# Patient Record
Sex: Female | Born: 1975 | Race: White | Hispanic: No | Marital: Married | Smoking: Never smoker
Health system: Southern US, Community
[De-identification: ages and names within clinical notes are randomized; demographics above are authoritative.]

## PROBLEM LIST (undated history)

## (undated) DIAGNOSIS — E039 Hypothyroidism, unspecified: Secondary | ICD-10-CM

## (undated) DIAGNOSIS — F32A Depression, unspecified: Secondary | ICD-10-CM

## (undated) DIAGNOSIS — F329 Major depressive disorder, single episode, unspecified: Secondary | ICD-10-CM

---

## 2001-01-30 ENCOUNTER — Inpatient Hospital Stay (HOSPITAL_COMMUNITY): Admission: EM | Admit: 2001-01-30 | Discharge: 2001-01-31 | Payer: Self-pay | Admitting: Emergency Medicine

## 2001-11-25 ENCOUNTER — Ambulatory Visit (HOSPITAL_COMMUNITY): Admission: RE | Admit: 2001-11-25 | Discharge: 2001-11-25 | Payer: Self-pay | Admitting: Internal Medicine

## 2001-11-25 ENCOUNTER — Encounter: Payer: Self-pay | Admitting: Internal Medicine

## 2002-09-07 ENCOUNTER — Encounter: Payer: Self-pay | Admitting: General Surgery

## 2002-09-07 ENCOUNTER — Ambulatory Visit (HOSPITAL_COMMUNITY): Admission: RE | Admit: 2002-09-07 | Discharge: 2002-09-07 | Payer: Self-pay | Admitting: General Surgery

## 2004-09-20 ENCOUNTER — Other Ambulatory Visit: Admission: RE | Admit: 2004-09-20 | Discharge: 2004-09-20 | Payer: Self-pay | Admitting: Obstetrics and Gynecology

## 2005-01-18 ENCOUNTER — Ambulatory Visit (HOSPITAL_COMMUNITY): Admission: RE | Admit: 2005-01-18 | Discharge: 2005-01-18 | Payer: Self-pay | Admitting: Obstetrics and Gynecology

## 2005-04-14 ENCOUNTER — Inpatient Hospital Stay (HOSPITAL_COMMUNITY): Admission: AD | Admit: 2005-04-14 | Discharge: 2005-04-18 | Payer: Self-pay | Admitting: Obstetrics and Gynecology

## 2005-04-15 ENCOUNTER — Encounter (INDEPENDENT_AMBULATORY_CARE_PROVIDER_SITE_OTHER): Payer: Self-pay | Admitting: *Deleted

## 2005-04-19 ENCOUNTER — Encounter: Admission: RE | Admit: 2005-04-19 | Discharge: 2005-05-18 | Payer: Self-pay | Admitting: Obstetrics and Gynecology

## 2005-06-12 ENCOUNTER — Other Ambulatory Visit: Admission: RE | Admit: 2005-06-12 | Discharge: 2005-06-12 | Payer: Self-pay | Admitting: Obstetrics and Gynecology

## 2005-12-19 ENCOUNTER — Encounter: Admission: RE | Admit: 2005-12-19 | Discharge: 2005-12-19 | Payer: Self-pay | Admitting: Gastroenterology

## 2006-02-18 ENCOUNTER — Encounter: Admission: RE | Admit: 2006-02-18 | Discharge: 2006-02-18 | Payer: Self-pay | Admitting: Gastroenterology

## 2006-09-22 ENCOUNTER — Emergency Department (HOSPITAL_COMMUNITY): Admission: EM | Admit: 2006-09-22 | Discharge: 2006-09-23 | Payer: Self-pay | Admitting: Emergency Medicine

## 2007-12-25 ENCOUNTER — Inpatient Hospital Stay (HOSPITAL_COMMUNITY): Admission: AD | Admit: 2007-12-25 | Discharge: 2007-12-27 | Payer: Self-pay | Admitting: Obstetrics and Gynecology

## 2007-12-28 ENCOUNTER — Inpatient Hospital Stay (HOSPITAL_COMMUNITY): Admission: AD | Admit: 2007-12-28 | Discharge: 2007-12-28 | Payer: Self-pay | Admitting: Obstetrics and Gynecology

## 2008-01-02 ENCOUNTER — Inpatient Hospital Stay (HOSPITAL_COMMUNITY): Admission: AD | Admit: 2008-01-02 | Discharge: 2008-01-02 | Payer: Self-pay | Admitting: Obstetrics and Gynecology

## 2008-01-20 ENCOUNTER — Inpatient Hospital Stay (HOSPITAL_COMMUNITY): Admission: RE | Admit: 2008-01-20 | Discharge: 2008-01-23 | Payer: Self-pay | Admitting: Obstetrics and Gynecology

## 2008-08-01 ENCOUNTER — Encounter: Admission: RE | Admit: 2008-08-01 | Discharge: 2008-08-01 | Payer: Self-pay | Admitting: Family Medicine

## 2008-09-16 ENCOUNTER — Ambulatory Visit: Payer: Self-pay | Admitting: *Deleted

## 2008-09-16 ENCOUNTER — Observation Stay (HOSPITAL_COMMUNITY): Admission: AD | Admit: 2008-09-16 | Discharge: 2008-09-16 | Payer: Self-pay | Admitting: *Deleted

## 2009-01-26 ENCOUNTER — Encounter: Admission: RE | Admit: 2009-01-26 | Discharge: 2009-01-26 | Payer: Self-pay | Admitting: Family Medicine

## 2010-02-09 ENCOUNTER — Ambulatory Visit (HOSPITAL_COMMUNITY): Admission: RE | Admit: 2010-02-09 | Discharge: 2010-02-11 | Payer: Self-pay | Admitting: Obstetrics and Gynecology

## 2010-02-09 ENCOUNTER — Encounter (INDEPENDENT_AMBULATORY_CARE_PROVIDER_SITE_OTHER): Payer: Self-pay | Admitting: Obstetrics and Gynecology

## 2010-02-09 HISTORY — PX: ABDOMINAL HYSTERECTOMY: SHX81

## 2010-11-05 LAB — CBC
HCT: 31.8 % — ABNORMAL LOW (ref 36.0–46.0)
HCT: 42.3 % (ref 36.0–46.0)
Hemoglobin: 11 g/dL — ABNORMAL LOW (ref 12.0–15.0)
Hemoglobin: 14.2 g/dL (ref 12.0–15.0)
MCH: 31.3 pg (ref 26.0–34.0)
MCHC: 33.6 g/dL (ref 30.0–36.0)
Platelets: 168 10*3/uL (ref 150–400)
Platelets: 208 10*3/uL (ref 150–400)
RBC: 3.52 MIL/uL — ABNORMAL LOW (ref 3.87–5.11)

## 2010-11-05 LAB — SURGICAL PCR SCREEN
MRSA, PCR: NEGATIVE
Staphylococcus aureus: NEGATIVE

## 2010-11-05 LAB — PREGNANCY, URINE: Preg Test, Ur: NEGATIVE

## 2011-01-02 NOTE — Discharge Summary (Signed)
NAMEJAMARIA, AMBORN NO.:  [id number]   MEDICAL RECORD NO.:  [id number]          PATIENT TYPE:  IPS   LOCATION:  0307                          FACILITY:  [facility]   PHYSICIAN:  Jasmine Pang, M.D. DATE OF BIRTH:  02-21-1976   DATE OF ADMISSION:  09/16/2008  DATE OF DISCHARGE:  09/16/2008                               DISCHARGE SUMMARY   REPORT TITLE:  Psychiatric admission assessment/discharge summary.   IDENTIFICATION:  This is a 35 year old married white female who was  admitted on a voluntary basis on September 16, 2008.   HISTORY OF PRESENT ILLNESS:  The patient has a history of depression and  postpartum anxiety.  She presents to the [facility], referred by her therapist,  Berniece Andreas due to suicidal ideation.  She had no plan, but with her  therapist was unable to contract for safety.  She now says she can  contract for safety.  She was having problems with her medications were  given.  The patient also had a severe panic attack yesterday requiring  her to visit her PCP.  The patient had been put on Depakote in September  by Horald Pollen, NP for [facility] and the Depakote level went  very high.  This started the patient's tremors, rapid heartbeat, and  panic attacks.  The patient's PCP put her on a beta-blocker and the  patient was started on Geodon and the condition worsen.  She began to  feel suicidal with severe crying.   PAST PSYCHIATRIC HISTORY:  The patient sees Horald Pollen, nurse  practitioner at Dr. Gregary Cromer office.  She has a diagnosis of  bipolar disorder.  She also sees to Berniece Andreas, at [facility] for postpartum depression.  She states she has  been depressed for 8 months since the birth of her baby.  She is on  Xanax 0.25 mg, ? frequency.   SUBSTANCE ABUSE HISTORY:  The patient denies drug or alcohol abuse.   FAMILY HISTORY:  Unknown.   SOCIAL HISTORY:  The patient lives with her husband and  64-month-old  child.  She also has a 34-year-old child.  She is not working at this  point.  She stays at home with her children.  She states her husband is  supportive, but not sure how to handle her depression.   MEDICAL PROBLEMS:  There were no acute physical or medical problems  noted.   MEDICATIONS:  Xanax 0.25 mg ? frequency.  She was on Geodon, but states  this began to cause problems tremor for her.   ALLERGIES:  She is allergic to MAGNESIUM, she states it cause her tight  chest, trouble breathing.Marland Kitchen   PHYSICAL FINDINGS:  There were no acute physical or medical problems  noted.   HOSPITAL COURSE:  The patient was very tearful about being in the  hospital.  She stated she was afraid, she did not want to stay.  Her  mother came to visit and asked if we would discharge her, she stated she  would stay with the patient more regularly and the she promised she  would not harm herself.  Mental status had improved.  Mood was less  depressed, less anxious.  Affect consistent with mood.  There was no  suicidal or homicidal ideation.  No thoughts of self-injurious behavior.  No auditory or visual hallucinations.  No paranoia or delusions.  Insight was good.  Judgment good.  Impulse control good.  It was felt  the patient was safe for discharge and would do better at home with her  mother than on the psychiatric unit where she was very frightened.  Her  mother agreed with this plan.   DISCHARGE DIAGNOSES:  Axis I:  Bipolar disorder, not otherwise  specified, anxiety disorder, not otherwise specified with features of  panic disorder and generalized anxiety disorder.  Axis II:  None.  Axis III:  None.  Axis IV:  Moderate (problems with primary support group, burden of being  mother to 2 young children, problems with medication trials, burden of  psychiatric illness).  Axis V:  Global assessment of functioning was 50 upon discharge.  GAF  was 45 upon admission.  GAF highest past year  75.   DISCHARGE PLANS:  There was no specific activity level or dietary  restrictions.   POSTHOSPITAL CARE PLANS:  The patient will return to see Horald Pollen,  her nurse practitioner at Dr. Loralie Champagne office for medication  management.  She will also see Berniece Andreas, tomorrow for counseling  appointment.      Jasmine Pang, M.D.  Electronically Signed     BHS/MEDQ  D:  09/16/2008  T:  09/17/2008  Job:  [id number]

## 2011-01-02 NOTE — Op Note (Signed)
Jamie Gill, Jamie Gill              ACCOUNT NO.:  [id number]   MEDICAL RECORD NO.:  [id number]          PATIENT TYPE:  INP   LOCATION:  9104                          [facility]ILITY:  [facility]   PHYSICIAN:  Michelle L. Grewal, M.D.DATE OF BIRTH:  Aug 05, 1976   DATE OF PROCEDURE:  DATE OF DISCHARGE:                               OPERATIVE REPORT   PREOPERATIVE DIAGNOSES:  1. Intrauterine pregnancy at 65 and 2/7 weeks.  2. Previous cesarean section and a history of preterm labor.   POSTOPERATIVE DIAGNOSES:  1. Intrauterine pregnancy at 23 and 2/7 weeks.  2. Previous cesarean section and a history of preterm labor.   PROCEDURE:  Repeat low transverse cesarean section.   SURGEON:  Michelle L. Grewal, MD.   ANESTHESIA:  Spinal.   FINDINGS:  Female infant in cephalic presentation with Apgars of 8 at 1  minute and 9 at 5 minutes.   PATHOLOGY:  None.   ESTIMATED BLOOD LOSS:  500 mL.   COMPLICATIONS:  None.   PROCEDURE:  The patient was taken to the operating room.  After consent  was obtained, she was then prepped and draped in the usual fashion after  spinal was inserted.  A Foley catheter was inserted.  A low transverse  incision was made, carried down to the fascia.  Fascia was scored in the  midline and extended laterally.  The rectus muscles were separated in  the midline.  The peritoneum was entered bluntly.  The peritoneal  incision was then stretched.  The bladder blade was inserted.  The lower  uterine segment was identified.  The bladder flap was created sharply  and then digitally.  The bladder blade was readjusted.  A low transverse  incision was made in the uterus.  Amniotic fluid was clear.  The baby  was in cephalic presentation.  He was delivered easily with a vacuum  extractor without difficulty.  The baby was a female infant of Apgars 8 at  1 minute and 9 at 5 minutes.  Cord blood was obtained after the cord was  clamped.  Of note, antibiotics and Pitocin were given.  The  placenta was  manually removed and noted be normal intact with a 3-vessel cord.  The  uterus was exteriorized and cleared of all clots and debris.  The  uterine incision was closed in 1 layer using 0 chromic in a running  locked stitch.  The uterus was returned to the abdomen.  Irrigation was performed.  The peritoneum and rectus muscles were  reapproximated using 0 Vicryl.  The fascia was closed using 0 Vicryl in  a running stitch.  After irrigation of the subcutaneous layer, the skin  was closed with staples.  All sponge, lap, and instrument counts were  correct x2.  The patient went to recovery room in stable condition.       Michelle L. Vincente Poli, M.D.  Electronically Signed     MLG/MEDQ  D:  01/20/2008  T:  01/21/2008  Job:  [id number]

## 2011-01-05 NOTE — Op Note (Signed)
NAMETIFANI, DACK NO.:  [id number]   MEDICAL RECORD NO.:  [id number]          PATIENT TYPE:  INP   LOCATION:  9105                          [facility]ILITY:  [facility]   PHYSICIAN:  Juluis Mire, M.D.   DATE OF BIRTH:  Sep 14, 1975   DATE OF PROCEDURE:  04/15/2005  DATE OF DISCHARGE:                                 OPERATIVE REPORT   PREOPERATIVE DIAGNOSES:  1.  Intrauterine pregnancy at term.  2.  Repetitive fetal heart rate decelerations.  3.  No progressive changes of the cervix.   POSTOPERATIVE DIAGNOSES:  1.  Intrauterine pregnancy at term.  2.  Repetitive fetal heart rate decelerations.  3.  No progressive changes of the cervix.   OPERATION/PROCEDURE:  Low transverse cesarean section.   SURGEON:  Juluis Mire, M.D.   ANESTHESIA:  Epidural.   ESTIMATED BLOOD LOSS:  400 mL.   PACKS AND DRAINS:  None.   INTRAOPERATIVE BLOOD REPLACED:  None.   COMPLICATIONS:  None.   INDICATIONS:  The patient is a 35 year old, primigravida, female who  presents with spontaneous onset of labor at term.  She was observed through  nightfall and morning. Artificial rupture of membranes revealed clear fluid.  The patient progressed approximately 3-4 cm of dilatation.  Then the infant  had an episode of fetal bradycardia to the 80's for 12 minutes, responded to  oxygen, position changes, hydration, and discontinued Pitocin.  After this,  the fetal heart rate was in the 160's, gradually decreased down to the 140's  to 150's. Started again having repetitive decelerations with each  contraction, some of which had a later component.  There was still decent  beat-to-beat variability but in view of this and the fact that cervix had  not significantly changed over the period of observation, we decided to  proceed with cesarean section.  Obviously, with the decelerations, Pitocin  could not be reinstituted and, therefore, with the lack of progression  without it, we felt that a  cesarean section was indicated.  The risks were  discussed including the risk of infection, the risk of hemorrhage, the risk  of injury to adjacent organs including bladder, bowel, or ureters, could  require further exploratory surgery, the risk of deep venous thrombosis and  pulmonary embolus.   DESCRIPTION OF PROCEDURE:  The patient was taken to the OR,  placed in the  supine position with left lateral tilt.  After satisfactory level of  epidural anesthesia was obtained, the abdomen was prepped out with Betadine  and draped with sterile field.  A low transverse skin incision was made with  the knife and carried through the subcutaneous tissue, fascia entered  sharply.  Incision in the fascia was extended laterally.  Fascia taken off  the muscles superiorly and inferiorly.  Rectus muscles were separated in the  midline.  Peritoneum was entered and incision in the peritoneum extended  both superiorly and inferiorly.  A low transverse bladder flap was  developed.  A low transverse uterine incision begun with the knife and  extended laterally using manual traction.  Infant presented in the  vertex  presentation was delivered with elevation of the head and fundal pressure.  The infant was a viable female.  Apgars were 8 and 9.  Umbilical artery PH was  7.27.  Placenta was delivered manually and sent for pathological review.  There was a cord around the shoulder that may have been the source of the  decelerations.  Uterus was then closed with interlocking suture of 0 chromic  using a two-layer closure technique.  We had excellent hemostasis.  Tubes  and ovaries were unremarkable.  We irrigated the pelvis.  We had good  hemostasis and clear urine output.  Muscles were reapproximated with running  suture of 3-0 Vicryl.  Fascia was closed with running suture of 0 PDS.  Skin  was closed with staples and Steri-Strips.  Sponge, instrument and needle  counts were reported as correct by the circulating  nurse x2.  Foley catheter  remained clear at time of closure.  The patient tolerated the procedure well  and was returned to the recovery room in good condition.      Juluis Mire, M.D.  Electronically Signed     JSM/MEDQ  D:  04/15/2005  T:  04/16/2005  Job:  [id number]

## 2011-01-05 NOTE — Discharge Summary (Signed)
Jamie Gill, Jamie Gill              ACCOUNT NO.:  [id number]   MEDICAL RECORD NO.:  [id number]          PATIENT TYPE:  INP   LOCATION:  9105                          [facility]ACILITY[facility]  [facility]   PHYSICIAN:  Duke Salvia. Marcelle Overlie, M.D.DATE OF BIRTH:  [birth year]   DATE OF ADMISSION:  04/14/2005  DATE OF DISCHARGE:  04/18/2005                                 DISCHARGE SUMMARY   ADMITTING DIAGNOSES:  1.  Intrauterine pregnancy at term.  2.  Spontaneous onset of labor.   DISCHARGE DIAGNOSES:  1.  Status post low transverse cesarean section secondary to nonreassuring      fetal heart tones.  2.  A viable female infant.   PROCEDURE:  Primary low transverse cesarean section.   REASON FOR ADMISSION:  Please see written H&P.   HOSPITAL COURSE:  The patient is 29-year primigravida white married female  that was admitted to [facility] and hospital with spontaneous onset of  labor. The patient was observed through the night.  The following morning,  artificial rupture of membranes was performed revealing clear fluid. The  patient did progress to approximately 3-4 cm of dilatation. The infant was  noted to have an episode of fetal bradycardia in the 80s for approximately  12 minutes which did respond to oxygen, position changes and hydration.  Pitocin had been discontinued. Fetal heart rate did respond to a rate of 160  which gradually decreased down to the 140s-150s. Repetitive decelerations  were noted with each contraction, some of which appeared to have a late  component. Beat-to-beat variability continued to be good. However, in view  of the deceleration pattern and the fact that the cervix had not  significantly changed over a period of observation, the decision was made to  proceed with a cesarean delivery. The patient was then transferred to the  operating room where epidural was dosed to an adequate surgical level. A low  transverse incision was made with the delivery of a viable female infant  weighing 5 pounds 11 ounces, Apgars of 8 at 1 minute and 9 at 5 minutes.  Arterial cord pH was 7.27. The patient tolerated the procedure well and was  taken to the recovery room in stable condition.   On postoperative day #1, the patient was without complaint. Vital signs  stable. Abdomen was soft with good return of bowel function. Fundus was firm  and nontender. Abdominal dressing was noted to be clean, dry and intact.  Laboratory findings revealed hemoglobin of 10.2, platelet count of 186,000,  WBC count of 9.6.   On postoperative day #2, the patient did complain of some soreness and some  lower extremity edema. Vital signs were stable. She was afebrile. Blood  pressures was 115/71.  Deep tendon reflexes were 1+ without clonus. Abdomen  was soft. Fundus was firm, nontender. Abdominal dressing had been removed  revealing an incision that was clean, dry and intact. The patient was  ambulating well, tolerating a regular diet, without complaints of nausea or  vomiting.   On postoperative day #3, the patient was without complaint. Vital signs were  stable. She was afebrile. Abdomen  was soft. Fundus was firm and nontender.  Incision was clean, dry and intact.  Instructions were reviewed, and the  patient was discharged home.   CONDITION ON DISCHARGE:  Good.   DIET:  Regular as tolerated.   ACTIVITY:  She is to do no heavy lifting, no driving x2 weeks, no vaginal  entry.   FOLLOW UP:  The patient is to follow up in the office in 1-2 weeks for an  incision check. She is to call for a temperature greater than 100 degrees,  persistent nausea and vomiting, heavy vaginal bleeding and/or redness or  drainage from the incisional site.   DISCHARGE MEDICATIONS:  1.  Percocet 5/325, #30, one p.o. every 4-6 hours p.r.n.  2.  Motrin 600 milligrams every 6 hours p.r.n.  3.  Prenatal vitamins one p.o. daily.  4.  Colace one p.o. daily p.r.n.      Jamie Gill, N.P.      Richard M.  Marcelle Overlie, M.D.  Electronically Signed    CC/MEDQ  D:  06/06/2005  T:  06/06/2005  Job:  [id number]

## 2011-01-05 NOTE — Discharge Summary (Signed)
Jamie Gill, Jamie Gill              ACCOUNT NO.:  [id number]   MEDICAL RECORD NO.:  [id number]          PATIENT TYPE:  INP   LOCATION:  9104                          [facility]ILITY[facility]   PHYSICIAN:  Dineen Kid. Rana Snare, M.D.    DATE OF BIRTH:  Jan 01, 1976   DATE OF ADMISSION:  01/20/2008  DATE OF DISCHARGE:  01/23/2008                               DISCHARGE SUMMARY   ADMITTING DIAGNOSES:  1. Intrauterine pregnancy at 38-2/7 weeks' estimated gestational age.  2. Previous cesarean section, desires repeat.   DISCHARGE DIAGNOSES:  1. Status post low-transverse cesarean section.  2. Viable female infant.   PROCEDURE:  Repeat low-transverse cesarean section.   REASON FOR ADMISSION:  Please see written H&P.   HOSPITAL COURSE:  The patient was a 34 year old, gravida 2, para 1, that  was admitted to [facility] for scheduled cesarean  section.  The patient had a previous cesarean delivery and desired  repeat.  On the morning of admission, the patient was taken to the  operating room where spinal anesthesia was administered without  difficulty.  A low-transverse incision was made with delivery of a  viable female infant weighing 6 pounds 6 ounces with Apgars of 8 at 1  minute and 9 at 5 minutes.  The patient tolerated the procedure well and  taken to the recovery room in stable condition.  On postoperative day  #1, the patient was without complaint other than some sinus congestion.  Vital signs were stable.  She was afebrile.  Fundus was firm and  nontender.  Incision was clean, dry, and intact.  Laboratory findings  revealed hemoglobin of 11.1, platelet count 162,000, and WBC count of  9.0.  On postoperative day #2, the patient was without complaint.  Vital  signs were stable.  She was afebrile.  Fundus was firm and nontender.  Abdominal dressings continued to have a small amount of drainage noted  on the bandage.  She is ambulating well.  On postoperative day #3, the  patient was ready  for discharge.  She was without complaint.  Vital  signs were stable.  She was afebrile.  Fundus was firm and nontender.  Incision was clean, dry, and intact.  Staples were removed and the  patient was later discharged home.   CONDITION ON DISCHARGE:  Stable.   DIET:  Regular as tolerated.   ACTIVITY:  No heavy lifting, no driving x2 weeks, no vaginal entry.   FOLLOWUP:  The patient is to follow up in the office in 1-2 weeks for an  incision check.  She is to call for temperature greater than 100  degrees, persistent nausea, vomiting, heavy vaginal bleeding, and/or  redness or drainage from the incisional site.   DISCHARGE MEDICATIONS:  1. Tylox, #30, 1 p.o. every 4-6 hours p.r.n.  2. Motrin 600 mg every 6 hours.  3. Prenatal vitamins 1 p.o. daily.  4. Augmentin 875 mg 1 p.o. b.i.d. x10 days for sinusitis.      Julio Sicks, N.P.      Dineen Kid Rana Snare, M.D.  Electronically Signed    CC/MEDQ  D:  02/10/2008  T:  02/10/2008  Job:  [id number]

## 2011-05-17 LAB — CBC
HCT: 31.8 — ABNORMAL LOW
Hemoglobin: 11.1 — ABNORMAL LOW
MCHC: 34.8
MCV: 87.7
RBC: 3.65 — ABNORMAL LOW
RBC: 4.15
WBC: 9

## 2011-05-17 LAB — RH IMMUNE GLOB WKUP(>/=20WKS)(NOT WOMEN'S HOSP): Fetal Screen: NEGATIVE

## 2011-05-17 LAB — CCBB MATERNAL DONOR DRAW

## 2011-05-17 LAB — RPR: RPR Ser Ql: NONREACTIVE

## 2011-11-23 ENCOUNTER — Other Ambulatory Visit: Payer: Self-pay | Admitting: Orthopaedic Surgery

## 2011-11-27 ENCOUNTER — Encounter (HOSPITAL_BASED_OUTPATIENT_CLINIC_OR_DEPARTMENT_OTHER): Payer: Self-pay | Admitting: *Deleted

## 2011-11-28 ENCOUNTER — Encounter (HOSPITAL_BASED_OUTPATIENT_CLINIC_OR_DEPARTMENT_OTHER): Payer: Self-pay | Admitting: *Deleted

## 2011-11-30 NOTE — H&P (Signed)
Jamie Gill is an 36 y.o. female.   Chief Complaint: right ankle pain HPI: Jamie Gill has right ankle pain with increased activities.  She was injured a number of months ago and has tried various treatments to resolve the discomfort.  She has been given a corticosteroid injection and other anti-inflammatory medications all of which were of minimal to no help.she has also been through physical therapy and continues to have discomfort when she ambulates. MRI scan: Shows tissue in the anterolateral gutter they could be impinging.  Past Medical History  Diagnosis Date  . Hypothyroidism   . Depression     Past Surgical History  Procedure Date  . Abdominal hysterectomy 02/09/2010    [facility]  . Cesarean section 01/20/2008; 04/15/2005    History reviewed. No pertinent family history. Social History:  reports that she has never smoked. She does not have any smokeless tobacco history on file. She reports that she drinks alcohol. She reports that she does not use illicit drugs.  Allergies:  Allergies  Allergen Reactions  . Magnesium Sulfate Anaphylaxis    No current facility-administered medications on file as of .   Medications Prior to Admission  Medication Sig Dispense Refill  . DULoxetine (CYMBALTA) 60 MG capsule Take 60 mg by mouth daily.      Marland Kitchen thyroid (ARMOUR) 120 MG tablet Take 120 mg by mouth daily.      . traMADol (ULTRAM) 50 MG tablet Take 50 mg by mouth every 6 (six) hours as needed.        No results found for this or any previous visit (from the past 48 hour(s)). No results found.  Review of Systems  Constitutional: Negative.   HENT: Negative.   Eyes: Negative.   Respiratory: Negative.   Cardiovascular: Negative.   Gastrointestinal: Negative.   Genitourinary: Negative.   Musculoskeletal: Negative.   Skin: Negative.   Neurological: Negative.   Endo/Heme/Allergies: Negative.   Psychiatric/Behavioral: Negative.     Height 5\' 2"  (1.575 m), weight 52.164 kg (115  lb). Physical Exam  Constitutional: She appears well-nourished.  HENT:  Head: Normocephalic.  Eyes: Pupils are equal, round, and reactive to light.  Neck: Normal range of motion.  Cardiovascular: Regular rhythm.   Respiratory: Breath sounds normal.  GI: Bowel sounds are normal.  Musculoskeletal:       Right ankle exam: She is very tender over the anterolateral aspect of the ankle.  There is good stability.  Normal sensory and motor function.  Negative drawer test.  There is minimal swelling on the lateral aspect of the ankle.  Neurological: She is alert.  Skin: Skin is warm.  Psychiatric: She has a normal mood and affect.     Assessment/Plan A: Right ankle impingement with history of severe sprain sustained June 12, 2011 T: We have discussed proceeding onward with a right ankle arthroscopy for Jamie Gill to help alleviate pain and restore full function.  We have discussed the risks of anesthesia, infection, DVT associated with this procedure.  Jamie Gill R 11/30/2011, 12:42 PM

## 2011-12-04 ENCOUNTER — Encounter (HOSPITAL_BASED_OUTPATIENT_CLINIC_OR_DEPARTMENT_OTHER)
Admission: RE | Disposition: A | Discharge status: Home / Self Care | Payer: Self-pay | Source: Ambulatory Visit | Attending: Orthopaedic Surgery

## 2011-12-04 ENCOUNTER — Encounter (HOSPITAL_BASED_OUTPATIENT_CLINIC_OR_DEPARTMENT_OTHER): Payer: Self-pay | Admitting: *Deleted

## 2011-12-04 ENCOUNTER — Encounter (HOSPITAL_BASED_OUTPATIENT_CLINIC_OR_DEPARTMENT_OTHER): Payer: Self-pay | Admitting: Anesthesiology

## 2011-12-04 ENCOUNTER — Ambulatory Visit (HOSPITAL_BASED_OUTPATIENT_CLINIC_OR_DEPARTMENT_OTHER): Payer: BC Managed Care – PPO | Admitting: *Deleted

## 2011-12-04 ENCOUNTER — Ambulatory Visit (HOSPITAL_BASED_OUTPATIENT_CLINIC_OR_DEPARTMENT_OTHER)
Admission: RE | Admit: 2011-12-04 | Discharge: 2011-12-04 | Disposition: A | Discharge status: Home / Self Care | Payer: BC Managed Care – PPO | Source: Ambulatory Visit | Attending: Orthopaedic Surgery | Admitting: Orthopaedic Surgery

## 2011-12-04 ENCOUNTER — Encounter (HOSPITAL_BASED_OUTPATIENT_CLINIC_OR_DEPARTMENT_OTHER): Payer: Self-pay

## 2011-12-04 DIAGNOSIS — M25879 Other specified joint disorders, unspecified ankle and foot: Secondary | ICD-10-CM

## 2011-12-04 HISTORY — DX: Hypothyroidism, unspecified: E03.9

## 2011-12-04 HISTORY — PX: ANKLE ARTHROSCOPY: SHX545

## 2011-12-04 HISTORY — DX: Major depressive disorder, single episode, unspecified: F32.9

## 2011-12-04 HISTORY — DX: Depression, unspecified: F32.A

## 2011-12-04 SURGERY — ARTHROSCOPY, ANKLE
Anesthesia: General | Site: Ankle | Laterality: Right | Wound class: Clean

## 2011-12-04 MED ORDER — BUPIVACAINE HCL (PF) 0.25 % IJ SOLN
INTRAMUSCULAR | Status: DC | PRN
  Administered 2011-12-04: 1 mL

## 2011-12-04 MED ORDER — PROPOFOL 10 MG/ML IV EMUL
INTRAVENOUS | Status: DC | PRN
  Administered 2011-12-04: 200 mg via INTRAVENOUS
  Administered 2011-12-04: 50 mg via INTRAVENOUS

## 2011-12-04 MED ORDER — LIDOCAINE HCL (CARDIAC) 20 MG/ML IV SOLN
INTRAVENOUS | Status: DC | PRN
  Administered 2011-12-04: 50 mg via INTRAVENOUS

## 2011-12-04 MED ORDER — FENTANYL CITRATE 0.05 MG/ML IJ SOLN
50.0000 ug | INTRAMUSCULAR | Status: DC | PRN
  Administered 2011-12-04: 100 ug via INTRAVENOUS

## 2011-12-04 MED ORDER — MIDAZOLAM HCL 2 MG/2ML IJ SOLN
1.0000 mg | INTRAMUSCULAR | Status: DC | PRN
  Administered 2011-12-04: 2 mg via INTRAVENOUS

## 2011-12-04 MED ORDER — LACTATED RINGERS IV SOLN: INTRAVENOUS | Status: DC

## 2011-12-04 MED ORDER — HYDROCODONE-ACETAMINOPHEN 5-325 MG PO TABS: 1.0000 | ORAL_TABLET | Freq: Four times a day (QID) | ORAL | Status: AC | PRN

## 2011-12-04 MED ORDER — CEFAZOLIN SODIUM-DEXTROSE 2-3 GM-% IV SOLR
2.0000 g | INTRAVENOUS | Status: AC
  Administered 2011-12-04: 1 g via INTRAVENOUS

## 2011-12-04 MED ORDER — CHLORHEXIDINE GLUCONATE 4 % EX LIQD: 60.0000 mL | Freq: Once | CUTANEOUS | Status: DC

## 2011-12-04 MED ORDER — DEXAMETHASONE SODIUM PHOSPHATE 4 MG/ML IJ SOLN
INTRAMUSCULAR | Status: DC | PRN
  Administered 2011-12-04: 10 mg via INTRAVENOUS

## 2011-12-04 MED ORDER — LACTATED RINGERS IV SOLN
INTRAVENOUS | Status: DC
  Administered 2011-12-04 (×2): via INTRAVENOUS

## 2011-12-04 MED ORDER — MIDAZOLAM HCL 5 MG/5ML IJ SOLN
INTRAMUSCULAR | Status: DC | PRN
  Administered 2011-12-04: 2 mg via INTRAVENOUS

## 2011-12-04 MED ORDER — LORAZEPAM 2 MG/ML IJ SOLN: 1.0000 mg | Freq: Once | INTRAMUSCULAR | Status: DC | PRN

## 2011-12-04 MED ORDER — HYDROMORPHONE HCL PF 1 MG/ML IJ SOLN
0.2500 mg | INTRAMUSCULAR | Status: DC | PRN
  Administered 2011-12-04 (×2): 0.5 mg via INTRAVENOUS

## 2011-12-04 MED ORDER — BUPIVACAINE-EPINEPHRINE PF 0.5-1:200000 % IJ SOLN
INTRAMUSCULAR | Status: DC | PRN
  Administered 2011-12-04: 50 mL

## 2011-12-04 MED ORDER — SODIUM CHLORIDE 0.9 % IR SOLN
Status: DC | PRN
  Administered 2011-12-04: 1000 mL

## 2011-12-04 MED ORDER — METHYLPREDNISOLONE ACETATE 40 MG/ML IJ SUSP
INTRAMUSCULAR | Status: DC | PRN
  Administered 2011-12-04: 40 mg via INTRA_ARTICULAR

## 2011-12-04 SURGICAL SUPPLY — 39 items
BANDAGE ELASTIC 4 VELCRO ST LF (GAUZE/BANDAGES/DRESSINGS) ×2 IMPLANT
BLADE CUTTER GATOR 3.5 (BLADE) ×2 IMPLANT
BLADE SURG 15 STRL LF DISP TIS (BLADE) ×1 IMPLANT
BLADE SURG 15 STRL SS (BLADE) ×1
BNDG ESMARK 4X9 LF (GAUZE/BANDAGES/DRESSINGS) IMPLANT
BUR CUDA 2.9 (BURR) IMPLANT
BUR GATOR 2.9 (BURR) IMPLANT
BUR OVAL 4.0 (BURR) IMPLANT
CANISTER SUCTION 2500CC (MISCELLANEOUS) ×2 IMPLANT
DRAPE ARTHROSCOPY W/POUCH 114 (DRAPES) ×2 IMPLANT
DRAPE U-SHAPE 47X51 STRL (DRAPES) ×2 IMPLANT
DRSG EMULSION OIL 3X3 NADH (GAUZE/BANDAGES/DRESSINGS) ×2 IMPLANT
DURAPREP 26ML APPLICATOR (WOUND CARE) ×2 IMPLANT
ELECT REM PT RETURN 9FT ADLT (ELECTROSURGICAL)
ELECT SMALL JOINT 90D BASC (ELECTRODE) IMPLANT
ELECTRODE REM PT RTRN 9FT ADLT (ELECTROSURGICAL) IMPLANT
GAUZE SPONGE 4X4 16PLY XRAY LF (GAUZE/BANDAGES/DRESSINGS) IMPLANT
GLOVE BIO SURGEON STRL SZ 6.5 (GLOVE) ×2 IMPLANT
GLOVE BIO SURGEON STRL SZ8.5 (GLOVE) ×2 IMPLANT
GLOVE BIOGEL PI IND STRL 8 (GLOVE) ×1 IMPLANT
GLOVE BIOGEL PI IND STRL 8.5 (GLOVE) ×1 IMPLANT
GLOVE BIOGEL PI INDICATOR 8 (GLOVE) ×1
GLOVE BIOGEL PI INDICATOR 8.5 (GLOVE) ×1
GLOVE SS BIOGEL STRL SZ 8 (GLOVE) ×1 IMPLANT
GLOVE SUPERSENSE BIOGEL SZ 8 (GLOVE) ×1
GOWN PREVENTION PLUS XLARGE (GOWN DISPOSABLE) ×4 IMPLANT
GOWN PREVENTION PLUS XXLARGE (GOWN DISPOSABLE) ×2 IMPLANT
PACK ARTHROSCOPY DSU (CUSTOM PROCEDURE TRAY) ×2 IMPLANT
PACK BASIN DAY SURGERY FS (CUSTOM PROCEDURE TRAY) ×2 IMPLANT
PENCIL BUTTON HOLSTER BLD 10FT (ELECTRODE) IMPLANT
SET ARTHROSCOPY TUBING (MISCELLANEOUS) ×1
SET ARTHROSCOPY TUBING LN (MISCELLANEOUS) ×1 IMPLANT
SLEEVE SCD COMPRESS KNEE MED (MISCELLANEOUS) IMPLANT
SPONGE GAUZE 4X4 12PLY (GAUZE/BANDAGES/DRESSINGS) ×2 IMPLANT
STRAP ANKLE FOOT DISTRACTOR (ORTHOPEDIC SUPPLIES) ×2 IMPLANT
SUT ETHILON 4 0 PS 2 18 (SUTURE) IMPLANT
TOWEL OR 17X24 6PK STRL BLUE (TOWEL DISPOSABLE) ×2 IMPLANT
TOWEL OR NON WOVEN STRL DISP B (DISPOSABLE) ×2 IMPLANT
WATER STERILE IRR 1000ML POUR (IV SOLUTION) ×2 IMPLANT

## 2011-12-04 NOTE — Op Note (Signed)
NAME:  Jamie Gill, Jamie Gill                   ACCOUNT NO.:  MEDICAL RECORD NO.:  [id number]  LOCATION:                                 FACILITY:  PHYSICIAN:  Lubertha Basque. Jerl Santos, M.D.     DATE OF BIRTH:  DATE OF PROCEDURE:  12/04/2011 DATE OF DISCHARGE:                              OPERATIVE REPORT   PREOPERATIVE DIAGNOSIS:  Right ankle impingement.  POSTOPERATIVE DIAGNOSIS:  Right ankle impingement.  PROCEDURE:  Right ankle arthroscopic synovectomy.  ANESTHESIA:  General.  ATTENDING SURGEON:  Lubertha Basque. Jerl Santos, M.D.  ASSISTANT:  Lindwood Qua, P.A.  INDICATION FOR PROCEDURE:  The patient is a 36 year old very active individual, who sprained her ankle about 6 months back.  She has persisted with difficulty with mostly anterolateral with some anteromedial pain despite bracing, physical therapy, and 2 injections both of which did afford her transient relief.  By MRI scan, she has some things consistent with impingement.  At this point, she is offered an arthroscopy.  Informed operative consent was obtained after discussion of possible complications including reaction to anesthesia, infection, and neuroma formation.  SUMMARY OF FINDINGS AND PROCEDURE:  Under general anesthesia, through 2 anterior portals, a right ankle arthroscopy was performed.  I saw no degenerative change with no evidence of any osteochondral lesion.  She did have some soft tissue impingement more lateral than medial and synovectomies were performed.  She was closed primarily and discharged home.  DESCRIPTION OF PROCEDURE:  The patient was taken to the operating suite where general anesthetic was applied without difficulty.  She was positioned supine with a stirrup under the right knee.  She was then prepped and draped in normal sterile fashion.  After administration of IV Kefzol, an arthroscopy of the right ankle was performed.  We placed some light traction on the ankle with a strap device.  I injected  the ankle with about 10 mL of saline from the medial aspect.  I then made a small medial incision just medial to the anterior tibial tendon.  The skin incision was made with blunt dissection through the capsule.  Then I placed the arthroscope across the joint.  We used a spinal needle to localize the proper position for anterolateral portal.  This was made in a similar fashion with a skin incision, followed by blunt dissection through to the capsule.  A shaver was placed.  She had findings as noted above and a synovectomy was performed with a shaver.  Again, the ankle was thoroughly inspected and no osteochondral lesions were seen.  The ankle was irrigated, followed by placement of Marcaine with small amount of Depo-Medrol.  The portals were loosely reapproximated with nylon followed by Adaptic, dry gauze, and loose Ace wrap.  Estimated blood loss and fluids can be obtained from anesthesia records.  DISPOSITION:  The patient was extubated in the operating room and taken to recovery in stable addition.  She was to go home on same-day and follow up in the office closely.  I will contact her by phone tonight.     Lubertha Basque Jerl Santos, M.D.     PGD/MEDQ  D:  12/04/2011  T:  12/04/2011  Job:  [id number]

## 2011-12-04 NOTE — Anesthesia Postprocedure Evaluation (Signed)
  Anesthesia Post-op Note  Patient: Jamie Gill  Procedure(s) Performed: Procedure(s) (LRB): ANKLE ARTHROSCOPY (Right)  Patient Location: PACU  Anesthesia Type: General and GA combined with regional for post-op pain  Level of Consciousness: awake and alert   Airway and Oxygen Therapy: Patient Spontanous Breathing  Post-op Pain: mild  Post-op Assessment: Post-op Vital signs reviewed, Patient's Cardiovascular Status Stable, Respiratory Function Stable, Patent Airway, No signs of Nausea or vomiting and Pain level controlled  Post-op Vital Signs: stable  Complications: No apparent anesthesia complications

## 2011-12-04 NOTE — Progress Notes (Signed)
Assisted Dr. Kasik with right, popliteal block. Side rails up, monitors on throughout procedure. See vital signs in flow sheet. Tolerated Procedure well. 

## 2011-12-04 NOTE — Anesthesia Preprocedure Evaluation (Addendum)
Anesthesia Evaluation  Patient identified by MRN, date of birth, ID band Patient awake    Reviewed: Allergy & Precautions, H&P , NPO status , Patient's Chart, lab work & pertinent test results  Airway Mallampati: I TM Distance: >3 FB Neck ROM: Full    Dental   Pulmonary    Pulmonary exam normal       Cardiovascular     Neuro/Psych Depression    GI/Hepatic   Endo/Other  Hypothyroidism   Renal/GU      Musculoskeletal   Abdominal   Peds  Hematology   Anesthesia Other Findings   Reproductive/Obstetrics                           Anesthesia Physical Anesthesia Plan  ASA: I  Anesthesia Plan: General   Post-op Pain Management:    Induction: Intravenous  Airway Management Planned: LMA  Additional Equipment:   Intra-op Plan:   Post-operative Plan: Extubation in OR  Informed Consent: I have reviewed the patients History and Physical, chart, labs and discussed the procedure including the risks, benefits and alternatives for the proposed anesthesia with the patient or authorized representative who has indicated his/her understanding and acceptance.   Dental Advisory Given  Plan Discussed with: CRNA and Surgeon  Anesthesia Plan Comments:        Anesthesia Quick Evaluation

## 2011-12-04 NOTE — Brief Op Note (Signed)
YATZIRY DEAKINS [id number] 12/04/2011   PRE-OP DIAGNOSIS: right ankle impingement  POST-OP DIAGNOSIS: same  PROCEDURE: right ankle arthroscopic synovectomy  ANESTHESIA: general  Honorio Devol G   Dictation #:  [id number]

## 2011-12-04 NOTE — Interval H&P Note (Signed)
History and Physical Interval Note:  12/04/2011 9:36 AM  Jamie Gill  has presented today for surgery, with the diagnosis of ANKLE SYNOVITIS RIGHT  The various methods of treatment have been discussed with the patient and family. After consideration of risks, benefits and other options for treatment, the patient has consented to  Procedure(s) (LRB): ANKLE ARTHROSCOPY (Right) as a surgical intervention .  The patients' history has been reviewed, patient examined, no change in status, stable for surgery.  I have reviewed the patients' chart and labs.  Questions were answered to the patient's satisfaction.     [name]

## 2011-12-04 NOTE — Anesthesia Procedure Notes (Addendum)
Anesthesia Regional Block:  Popliteal block  Pre-Anesthetic Checklist: ,, timeout performed, Correct Patient, Correct Site, Correct Laterality, Correct Procedure, Correct Position, site marked, Risks and benefits discussed,  Surgical consent,  Pre-op evaluation,  At surgeon's request and post-op pain management  Laterality: Right  Prep: chloraprep       Needles:  Injection technique: Single-shot  Needle Type: Echogenic Stimulator Needle      Needle Gauge: 22 and 22 G    Additional Needles:  Procedures: nerve stimulator Popliteal block  Nerve Stimulator or Paresthesia:  Response: 0.5 mA,   Additional Responses:   Narrative:  Start time: 12/04/2011 9:16 AM End time: 12/04/2011 9:30 AM Injection made incrementally with aspirations every 5 mL. Anesthesiologist: Dr Gypsy Balsam  Additional Notes: [phone] R Pop N Block POP CHG prep, sterile tech #[id number] stim needle w/stim down to .5ma Multi neg asp Marc .5% w/epi 1:20000 total 50cc No compl Dr Gypsy Balsam     Procedure Name: LMA Insertion Date/Time: 12/04/2011 10:15 AM Performed by: Caren Macadam Pre-anesthesia Checklist: Patient identified, Emergency Drugs available, Suction available and Patient being monitored Patient Re-evaluated:Patient Re-evaluated prior to inductionOxygen Delivery Method: Circle System Utilized Preoxygenation: Pre-oxygenation with 100% oxygen Intubation Type: IV induction and Combination inhalational/ intravenous induction Ventilation: Mask ventilation without difficulty LMA: LMA inserted LMA Size: 4.0 and 3.0 Number of attempts: 1 Airway Equipment and Method: bite block Placement Confirmation: positive ETCO2 and breath sounds checked- equal and bilateral Tube secured with: Tape Dental Injury: Teeth and Oropharynx as per pre-operative assessment

## 2011-12-04 NOTE — Transfer of Care (Signed)
Immediate Anesthesia Transfer of Care Note  Patient: Jamie Gill  Procedure(s) Performed: Procedure(s) (LRB): ANKLE ARTHROSCOPY (Right)  Patient Location: PACU  Anesthesia Type: GA combined with regional for post-op pain  Level of Consciousness: sedated  Airway & Oxygen Therapy: Patient Spontanous Breathing and Patient connected to face mask oxygen  Post-op Assessment: Report given to PACU RN and Post -op Vital signs reviewed and stable  Post vital signs: Reviewed and stable  Complications: No apparent anesthesia complications

## 2011-12-04 NOTE — Discharge Instructions (Addendum)
Ice / elevation May WBAT Leave dressing on till appt. Crutches as needed Post Anesthesia Home Care Instructions  Activity: Get plenty of rest for the remainder of the day. A responsible adult should stay with you for 24 hours following the procedure.  For the next 24 hours, DO NOT: -Drive a car -Advertising copywriter -Drink alcoholic beverages -Take any medication unless instructed by your physician -Make any legal decisions or sign important papers.  Meals: Start with liquid foods such as gelatin or soup. Progress to regular foods as tolerated. Avoid greasy, spicy, heavy foods. If nausea and/or vomiting occur, drink only clear liquids until the nausea and/or vomiting subsides. Call your physician if vomiting continues.  Special Instructions/Symptoms: Your throat may feel dry or sore from the anesthesia or the breathing tube placed in your throat during surgery. If this causes discomfort, gargle with warm salt water. The discomfort should disappear within 24 hours.  Regional Anesthesia Blocks  1. Numbness or the inability to move the "blocked" extremity may last from 3-48 hours after placement. The length of time depends on the medication injected and your individual response to the medication. If the numbness is not going away after 48 hours, call your surgeon.  2. The extremity that is blocked will need to be protected until the numbness is gone and the  Strength has returned. Because you cannot feel it, you will need to take extra care to avoid injury. Because it may be weak, you may have difficulty moving it or using it. You may not know what position it is in without looking at it while the block is in effect.  3. For blocks in the legs and feet, returning to weight bearing and walking needs to be done carefully. You will need to wait until the numbness is entirely gone and the strength has returned. You should be able to move your leg and foot normally before you try and bear weight or  walk. You will need someone to be with you when you first try to ensure you do not fall and possibly risk injury.  4. Bruising and tenderness at the needle site are common side effects and will resolve in a few days.  5. Persistent numbness or new problems with movement should be communicated to the surgeon or the [facility] [phone] [facility] [phone]).

## 2011-12-05 ENCOUNTER — Encounter (HOSPITAL_BASED_OUTPATIENT_CLINIC_OR_DEPARTMENT_OTHER): Payer: Self-pay | Admitting: Orthopaedic Surgery

## 2011-12-10 ENCOUNTER — Encounter (HOSPITAL_BASED_OUTPATIENT_CLINIC_OR_DEPARTMENT_OTHER): Payer: Self-pay

## 2013-03-20 ENCOUNTER — Other Ambulatory Visit: Payer: Self-pay | Admitting: Obstetrics and Gynecology

## 2014-05-18 ENCOUNTER — Ambulatory Visit (INDEPENDENT_AMBULATORY_CARE_PROVIDER_SITE_OTHER): Payer: No Typology Code available for payment source | Admitting: Internal Medicine

## 2014-05-18 ENCOUNTER — Encounter: Payer: Self-pay | Admitting: Internal Medicine

## 2014-05-18 VITALS — BP 106/68 | HR 76 | Temp 98.2°F | Resp 12 | Ht 62.0 in | Wt 118.6 lb

## 2014-05-18 DIAGNOSIS — E039 Hypothyroidism, unspecified: Secondary | ICD-10-CM | POA: Insufficient documentation

## 2014-05-18 MED ORDER — SYNTHROID 50 MCG PO TABS: 50.0000 ug | ORAL_TABLET | Freq: Every day | ORAL | Status: DC

## 2014-05-18 NOTE — Progress Notes (Addendum)
Patient ID: Jamie Gill, female   DOB: 04/22/1976, 38 y.o.   MRN: [id number]   HPI  Jamie Gill is a 38 y.o.-year-old female, referred by Jamie Gill, in consultation for uncontrolled hypothyroidism.  Pt. has been dx with hypothyroidism in 2010 - started to see [facility] - started on Armour - it helped a little but not permanently - started to see Jamie Gill ([facility] in [facility]) >> started on many supplements including 2 for thyroid (see below) - still difficult to regulate the thyroid tests.  She was on - stopped them 1 week: - Thyrotropin PMG 2 tabs and b'fast, 1 at dinner - Promaline iodine 1 at b'fast  She was taking these: - fasting - with water - with b'fast  - no calcium, iron, PPIs, multivitamins   I reviewed pt's thyroid tests - TSH high: 03/31/2014: TSH 12.43; TT4 5.4, fT3 2.9, fT4 0.98  11/16/2013: TSH 10.57, ATA <1.0, TPO Abs 9 (0-34), rT3 9.4 (9.2-24.1), TT4 4.8, fT3 3.6  Pt describes: - + depression - + chocking  - + fatigue - started after the second child (6 years ago) - no weight gain/loss (had weight loss in 2009 after coming off gluten - now restarted some gluten) - no cold intolerance - no constipation/no diarrhea/+ some nausea - no dry skin - some hair falling - + depression  Pt is feeling her neck enlarged, no hoarseness, + dysphagia (can have food stop in her throat occasionally)/no odynophagia, no SOB with lying down.  She has + FH of thyroid disorders in: mother - hypothyroidism. No FH of thyroid cancer.  No h/o radiation tx to head or neck.  I reviewed her chart and she also has a history of depression; had a hysterectomy (TAH) in 2011 - adenomyosis; adrenal fatigue; leaky gut; sensitivity to gluten  ROS: Constitutional: see HPI Eyes: no blurry vision, no xerophthalmia ENT: no sore throat, + nodules palpated in throat, + dysphagia/odynophagia, no hoarseness Cardiovascular: no CP/SOB/palpitations/leg  swelling Respiratory: no cough/SOB Gastrointestinal: + N/no V/D/C Musculoskeletal: no muscle/joint aches Skin: no rashes, slight hair loss Neurological: no tremors/numbness/tingling/dizziness, + HA Psychiatric: + both  depression/anxiety + low libido  Past Medical History  Diagnosis Date  . Hypothyroidism   . Depression    Past Surgical History  Procedure Laterality Date  . Abdominal hysterectomy  02/09/2010    [facility]  . Cesarean section  01/20/2008; 04/15/2005  . Ankle arthroscopy  12/04/2011    Procedure: ANKLE ARTHROSCOPY;  Surgeon: Velna Ochs, MD;  Location: [facility];  Service: Orthopedics;  Laterality: Right;  right ankle arthroscopy with synovectomy   History   Social History  . Marital Status: Married    Spouse Name: N/A    Number of Children: 69 and 5 y/o   Occupational History  . Co-director preschool   Social History Main Topics  . Smoking status: Never Smoker   . Smokeless tobacco: Not on file  . Alcohol Use: Yes     Comment:beer daily   . Drug Use: No   Meds: - vitamin D 5000 iu daily - Ashwaganda 1 tab in am - Cataplex B 1 tab in am - Nevatin 1 tab bid  Allergies  Allergen Reactions  . Magnesium Sulfate Anaphylaxis   FH: - HTN in father - lung cancer in father  PE: BP 106/68  Pulse 76  Temp(Src) 98.2 F (36.8 C) (Oral)  Resp 12  Ht 5\' 2"  (1.575 m)  Wt 118 lb  9.6 oz (53.797 kg)  BMI 21.69 kg/m2  SpO2 96% Wt Readings from Last 3 Encounters:  05/18/14 118 lb 9.6 oz (53.797 kg)  11/28/11 115 lb (52.164 kg)  11/28/11 115 lb (52.164 kg)   Constitutional: normal weight, in NAD Eyes: PERRLA, EOMI, no exophthalmos ENT: moist mucous membranes, slight thyromegaly, no nodules palpated, no cervical lymphadenopathy Cardiovascular: RRR, No MRG Respiratory: CTA B Gastrointestinal: abdomen soft, NT, ND, BS+ Musculoskeletal: no deformities, strength intact in all 4 Skin: moist, warm, no rashes Neurological: no tremor with  outstretched hands, DTR normal in all 4  ASSESSMENT: 1. Hypothyroidism  2. Choking  PLAN:  1. Patient with long-standing hypothyroidism, not on levothyroxine therapy, only on supplements. TSH high for at least the 6 months. She appears euthyroid. She does not appear to have a goiter, thyroid nodules, but has some choking occasionally - We discussed about positive and negative aspects of using a natural thyroid extract (NatureThroid or Armour). I underlined the fact that:  Armour is purified from porcine thyroid glands, which is not without risk for contaminants  Also, the ratio between T4 and T3 in Armour is physiologic for pigs, not for humans.  The short half life of T3 can cause fluctuations in blood levels, which can result in mood swings and heart rhythm abnormalities.  The concentration of the active substances (T4 and T3) can be expected to vary between different Armour lots, which can cause variation in the thyroid function tests.  - we discussed to start LT4 (DAW Synthroid) 50 mcg daily >> she agrees - We discussed about correct intake of levothyroxine, fasting, with water, separated by at least 30 minutes from breakfast, and separated by more than 4 hours from calcium, iron, multivitamins, acid reflux medications (PPIs). - will check thyroid tests in 5 weeks after she starts the LT4: TSH, free T4, free T3 - for now, I advised her to stay off the thyroid supplements - If TFTs are abnormal, she will need to return in 6-8 weeks for repeat labs - If TFTs are normal, I will see her back in 4 months  2. Choking - occasional - possibly related to increased TSH, which can have a growth factor effect on the thyroid - we will check a thyroids U/S to check for nodules  - time spent with the patient: 1 hour, of which >50% was spent in obtaining information about her symptoms, reviewing her previous labs, evaluations, and treatments, counseling her about her condition (please see the discussed  topics above), and developing a plan to further investigate and treat it. she had a number of questions which I addressed.  CLINICAL DATA: Large thyroid on physical exam, elevated TSH for 6 months. Recently started on Synthroid  EXAM: THYROID ULTRASOUND  TECHNIQUE: Ultrasound examination of the thyroid gland and adjacent soft tissues was performed.  COMPARISON: None.  FINDINGS: Right thyroid lobe  Measurements: 4.4 x 1.0 x 1.5 cm. No nodules visualized.  Left thyroid lobe  Measurements: 5.2 x 1.1 x 1.5 cm. No nodules visualized.  Isthmus  Thickness: 2.7 mm. No nodules visualized.  Lymphadenopathy  None visualized.  IMPRESSION: No focal nodule is identified. No significant heterogeneity is seen.   Electronically Signed By: Alcide CleverMark Lukens M.D. On: 05/20/2014 16:20  Normal thyroid U/S.

## 2014-05-18 NOTE — Patient Instructions (Addendum)
Please start Synthroid 50 mcg daily in am. Take the thyroid hormone every day, with water, >30 minutes before breakfast, separated by >4 hours from acid reflux medications, calcium, iron, multivitamins. Continue to stay off the thyroid supplements for now.  Please come back for labs in 5 weeks and for a visit in 4 months.  You will be called with the schedule for the thyroid ultrasound.

## 2014-05-20 ENCOUNTER — Ambulatory Visit
Admission: RE | Admit: 2014-05-20 | Discharge: 2014-05-20 | Disposition: A | Discharge status: Home / Self Care | Payer: BC Managed Care – PPO | Source: Ambulatory Visit | Attending: Internal Medicine | Admitting: Internal Medicine

## 2014-06-15 ENCOUNTER — Other Ambulatory Visit: Payer: BC Managed Care – PPO

## 2014-06-16 ENCOUNTER — Other Ambulatory Visit (INDEPENDENT_AMBULATORY_CARE_PROVIDER_SITE_OTHER): Payer: No Typology Code available for payment source

## 2014-06-16 ENCOUNTER — Other Ambulatory Visit: Payer: No Typology Code available for payment source

## 2014-06-16 DIAGNOSIS — E039 Hypothyroidism, unspecified: Secondary | ICD-10-CM

## 2014-06-16 LAB — T4, FREE: Free T4: 1.11 ng/dL (ref 0.60–1.60)

## 2014-06-16 LAB — T3, FREE: T3 FREE: 2.6 pg/mL (ref 2.3–4.2)

## 2014-06-16 LAB — TSH: TSH: 3.31 u[IU]/mL (ref 0.35–4.50)

## 2014-07-05 ENCOUNTER — Other Ambulatory Visit: Payer: Self-pay | Admitting: Dermatology

## 2014-08-25 ENCOUNTER — Other Ambulatory Visit: Payer: Self-pay | Admitting: Obstetrics and Gynecology

## 2014-08-26 LAB — CYTOLOGY - PAP

## 2014-09-17 ENCOUNTER — Ambulatory Visit (INDEPENDENT_AMBULATORY_CARE_PROVIDER_SITE_OTHER): Payer: 59 | Admitting: Internal Medicine

## 2014-09-17 ENCOUNTER — Encounter: Payer: Self-pay | Admitting: Internal Medicine

## 2014-09-17 VITALS — BP 102/68 | HR 89 | Temp 97.9°F | Resp 12 | Wt 123.8 lb

## 2014-09-17 DIAGNOSIS — E039 Hypothyroidism, unspecified: Secondary | ICD-10-CM

## 2014-09-17 LAB — T4, FREE: Free T4: 0.97 ng/dL (ref 0.60–1.60)

## 2014-09-17 LAB — TSH: TSH: 1.47 u[IU]/mL (ref 0.35–4.50)

## 2014-09-17 MED ORDER — SYNTHROID 50 MCG PO TABS: 50.0000 ug | ORAL_TABLET | Freq: Every day | ORAL | Status: DC

## 2014-09-17 NOTE — Progress Notes (Signed)
Patient ID: [name], female   DOB: [birth year], 39 y.o.   MRN: [id number]   HPI  Jamie Gill is a 39 y.o.-year-old female, initially referred by Jamie Gill, in consultation for uncontrolled hypothyroidism. Last visit 4 months ago.  Reviewed history: Pt. has been dx with hypothyroidism in 2010 - started to see [facility] - started on Armour - it helped a little but not permanently - started to see Jamie Gill ([facility] in [facility]) >> started on many supplements including 2 for thyroid (see below).  Patient was previously on the following supplements, which she stopped 1 week prior to her previous visit. - Thyrotropin PMG 2 tabs and b'fast, 1 at dinner - Promaline iodine 1 at b'fast  We started levothyroxine (Synthroid DAW) 50 g daily, which she is taking (in the last 1.5 mo): - fasting - with water - with b'fast  - no calcium, iron, PPIs, multivitamins   After we started the above LT 4 dose, patient's thyroid tests finally normalized:  Lab Results  Component Value Date   TSH 3.31 06/16/2014   FREET4 1.11 06/16/2014  03/31/2014: TSH 12.43; TT4 5.4, fT3 2.9, fT4 0.98  11/16/2013: TSH 10.57, ATA <1.0, TPO Abs 9 (0-34), rT3 9.4 (9.2-24.1), TT4 4.8, fT3 3.6  Pt describes: - + fatigue - started after the second child (6 years ago), but more recently - + brittle nails - + fluid retention - + weigh gain - no depression - no more chocking  - + fatigue - no weight gain/loss (had weight loss in 2009 after coming off gluten - now restarted some gluten) - no cold intolerance - no constipation/no diarrhea - no dry skin  Pt is feeling her neck enlarged, no hoarseness, + occasional dysphagia/no odynophagia, no SOB with lying down.  I reviewed her chart and she also has a history of depression; had a hysterectomy (TAH) in 2011 - adenomyosis; adrenal fatigue; leaky gut; sensitivity to gluten  ROS: Constitutional: see HPI Eyes: no blurry vision,  no xerophthalmia ENT: no sore throat, no nodules palpated in throat,no dysphagia/odynophagia, no hoarseness Cardiovascular: no CP/+ SOB/no palpitations/leg swelling Respiratory: no cough/+ SOB Gastrointestinal: no N/V/D/C Musculoskeletal: no muscle/joint aches Skin: no rashes, no hair loss Neurological: no tremors/numbness/tingling/dizziness, + HA  I reviewed pt's medications, allergies, PMH, social hx, family hx, and changes were documented in the history of present illness. Otherwise, unchanged from my initial visit note: Past Medical History  Diagnosis Date  . Hypothyroidism   . Depression    Past Surgical History  Procedure Laterality Date  . Abdominal hysterectomy  02/09/2010    [facility]  . Cesarean section  01/20/2008; 04/15/2005  . Ankle arthroscopy  12/04/2011    Procedure: ANKLE ARTHROSCOPY;  Surgeon: Velna Ochs, MD;  Location: [facility];  Service: Orthopedics;  Laterality: Right;  right ankle arthroscopy with synovectomy   History   Social History  . Marital Status: Married    Spouse Name: N/A    Number of Children: 2 and 5 y/o   Occupational History  . Co-director preschool   Social History Main Topics  . Smoking status: Never Smoker   . Smokeless tobacco: Not on file  . Alcohol Use: Yes     Comment:beer daily   . Drug Use: No   Meds: - vitamin D 5000 iu daily - Ashwaganda 1 tab in am - Cataplex B 1 tab in am - Nevatin 1 tab bid  Allergies  Allergen Reactions  .  Magnesium Sulfate Anaphylaxis   FH: - HTN in father - lung cancer in father  PE: BP 102/68 mmHg  Pulse 89  Temp(Src) 97.9 F (36.6 C) (Oral)  Resp 12  Wt 123 lb 12.8 oz (56.155 kg)  SpO2 98% Wt Readings from Last 3 Encounters:  09/17/14 123 lb 12.8 oz (56.155 kg)  05/18/14 118 lb 9.6 oz (53.797 kg)  11/28/11 115 lb (52.164 kg)   Constitutional: normal weight, in NAD Eyes: PERRLA, EOMI, no exophthalmos ENT: moist mucous membranes, slight thyromegaly, no nodules  palpated, no cervical lymphadenopathy Cardiovascular: RRR, No MRG Respiratory: CTA B Gastrointestinal: abdomen soft, NT, ND, BS+ Musculoskeletal: no deformities, strength intact in all 4 Skin: moist, warm, no rashes Neurological: no tremor with outstretched hands, DTR normal in all 4  ASSESSMENT: 1. Hypothyroidism  2. Choking - thyroid U/S (05/20/2014): No focal nodule is identified. No significant heterogeneity is seen.  PLAN:  1. Patient with long-standing hypothyroidism, not on levothyroxine therapy, only on supplements. TSH finally normal on 50 mcg LT4. She felt good initially, now more tired and gained weight (fluid retention) - We discussed about correct intake of levothyroxine, fasting, with water, separated by at least 30 minutes from breakfast, and separated by more than 4 hours from calcium, iron, multivitamins, acid reflux medications (PPIs). - will check thyroid tests today: TSH, free T4 - If TFTs are abnormal, she will need to return in 6-8 weeks for repeat labs - If TFTs are normal, I will see her back in 6 months  2. Choking - very slight - thyroid U/S normal  Office Visit on 09/17/2014  Component Date Value Ref Range Status  . TSH 09/17/2014 1.47  0.35 - 4.50 uIU/mL Final  . Free T4 09/17/2014 0.97  0.60 - 1.60 ng/dL Final   TFTs great >> continur LT4 50 mcg daily. Will recheck them when she returns.

## 2014-09-17 NOTE — Patient Instructions (Signed)
Please stop at the lab.  Please come back for a follow-up appointment in 6 months.  

## 2014-09-28 ENCOUNTER — Other Ambulatory Visit: Payer: Self-pay | Admitting: Internal Medicine

## 2014-12-09 ENCOUNTER — Other Ambulatory Visit: Payer: Self-pay | Admitting: Dermatology

## 2015-03-18 ENCOUNTER — Ambulatory Visit: Payer: No Typology Code available for payment source | Admitting: Internal Medicine

## 2017-04-12 ENCOUNTER — Ambulatory Visit (INDEPENDENT_AMBULATORY_CARE_PROVIDER_SITE_OTHER): Payer: 59 | Admitting: Internal Medicine

## 2017-04-12 ENCOUNTER — Encounter: Payer: Self-pay | Admitting: Internal Medicine

## 2017-04-12 VITALS — BP 118/70 | HR 80 | Wt 116.0 lb

## 2017-04-12 DIAGNOSIS — E049 Nontoxic goiter, unspecified: Secondary | ICD-10-CM | POA: Insufficient documentation

## 2017-04-12 DIAGNOSIS — E039 Hypothyroidism, unspecified: Secondary | ICD-10-CM | POA: Diagnosis not present

## 2017-04-12 LAB — T3, FREE: T3 FREE: 3.4 pg/mL (ref 2.3–4.2)

## 2017-04-12 LAB — T4, FREE: FREE T4: 0.94 ng/dL (ref 0.60–1.60)

## 2017-04-12 LAB — TSH: TSH: 7.01 u[IU]/mL — AB (ref 0.35–4.50)

## 2017-04-12 MED ORDER — TIROSINT 25 MCG PO CAPS: 25.0000 ug | ORAL_CAPSULE | Freq: Every day | ORAL | 1 refills | Status: DC

## 2017-04-12 NOTE — Patient Instructions (Addendum)
Please stop at the lab.  After the results are back, we may need to start Tirosint.  Please come back for a follow-up appointment in 3 months.

## 2017-04-12 NOTE — Progress Notes (Signed)
Patient ID: Jamie Gill, female   DOB: 03-19-1976, 41 y.o.   MRN: [id number]   HPI  Jamie Gill is a 41 y.o.-year-old female, initially referred by Erick Colace, returning for f/u for hypothyroidism. Last visit almost 3 years ago.  Reviewed hx Pt. has been dx with hypothyroidism in 2010 - started to see [facility] - started on Armour - it helped a little but not permanently - started to see Erick Colace ([facility] in [facility]) >> started on many supplements including 2 for thyroid (see below).  Patient was previously on the following supplements: - Thyrotropin PMG 2 tabs and b'fast, 1 at dinner - Promaline iodine 1 at b'fast  We stopped them and started levothyroxine 50 mcg daily >> weight gain, generalized pain, tired  - felt worse.  She then started: - iodine - thyroxal (bovine thyroid glandular, coenzymes, and phytonutrients such as ashwagandha root)  She was taking the supplements: - in am - fasting - at least 30 min from b'fast - no Ca, Fe, MVI, PPIs - not on Biotin  After we started the above LT4 dose, patient's thyroid tests finally normalized, but then increased after she came off the LT4: 12/17/2016: TSH 10.03, fT4 1.12, fT 3.3 2016: TSH 6.13, fT41.35, fT3 2.8 Lab Results  Component Value Date   TSH 1.47 09/17/2014   TSH 3.31 06/16/2014   FREET4 0.97 09/17/2014   FREET4 1.11 06/16/2014  03/31/2014: TSH 12.43; TT4 5.4, fT3 2.9, fT4 0.98  11/16/2013: TSH 10.57, ATA <1.0, TPO Abs 9 (0-34), rT3 9.4 (9.2-24.1), TT4 4.8, fT3 3.6  Pt describes: - + fatigue - started after the second child  - no signif. weight gain - no constipation - no depression  Since last visit, in last month >> she started to have neck swelling: - + feeling nodules in neck - + hoarseness - + dysphagia (actually discomfort) - no choking - no SOB with lying down  She has a history of depression; had a hysterectomy (TAH) in 2011 - adenomyosis; adrenal fatigue,  leaky gut, SSTV to gluten  ROS: Constitutional: + see HPI Eyes: no blurry vision, no xerophthalmia ENT: no sore throat, + see HPI Cardiovascular: no CP/no SOB/no palpitations/no leg swelling Respiratory: no cough/no SOB/no wheezing Gastrointestinal: no N/no V/no D/no C/no acid reflux Musculoskeletal: no muscle aches/no joint aches Skin: no rashes, no hair loss Neurological: no tremors/no numbness/no tingling/no dizziness  I reviewed pt's medications, allergies, PMH, social hx, family hx, and changes were documented in the history of present illness. Otherwise, unchanged from my initial visit note.  Past Medical History:  Diagnosis Date  . Depression   . Hypothyroidism    Past Surgical History:  Procedure Laterality Date  . ABDOMINAL HYSTERECTOMY  02/09/2010   LAVH  . ANKLE ARTHROSCOPY  12/04/2011   Procedure: ANKLE ARTHROSCOPY;  Surgeon: Velna Ochs, MD;  Location: [facility];  Service: Orthopedics;  Laterality: Right;  right ankle arthroscopy with synovectomy  . CESAREAN SECTION  01/20/2008; 04/15/2005   History   Social History  . Marital Status: Married    Spouse Name: N/A    Number of Children: 2   Occupational History  . Co-director preschool   Social History Main Topics  . Smoking status: Never Smoker   . Smokeless tobacco: Not on file  . Alcohol Use: Yes     Comment:beer   . Drug Use: No   Current Outpatient Prescriptions  Medication Sig Dispense Refill  . SYNTHROID 50  MCG tablet Take 1 tablet (50 mcg total) by mouth daily before breakfast. 90 tablet 2  . SYNTHROID 50 MCG tablet TAKE 1 TABLET (50 MCG TOTAL) BY MOUTH DAILY BEFORE BREAKFAST. 45 tablet 2   No current facility-administered medications for this visit.      Allergies  Allergen Reactions  . Magnesium Sulfate Anaphylaxis   FH: - HTN in father - lung cancer in father  PE: BP 118/70 (BP Location: Left Arm, Patient Position: Sitting)   Pulse 80   Wt 116 lb (52.6 kg)   SpO2  98%   BMI 21.22 kg/m  Wt Readings from Last 3 Encounters:  04/12/17 116 lb (52.6 kg)  09/17/14 123 lb 12.8 oz (56.2 kg)  05/18/14 118 lb 9.6 oz (53.8 kg)   Constitutional: normal weight, in NAD Eyes: PERRLA, EOMI, no exophthalmos ENT: moist mucous membranes, + slight, symmetric thyromegaly, no cervical lymphadenopathy Cardiovascular: RRR, No MRG Respiratory: CTA B Gastrointestinal: abdomen soft, NT, ND, BS+ Musculoskeletal: no deformities, strength intact in all 4 Skin: moist, warm, no rashes Neurological: no tremor with outstretched hands, DTR normal in all 4  ASSESSMENT: 1. Hypothyroidism  2. Enlarged thyroid - thyroid U/S (05/20/2014): No focal nodule is identified. No significant heterogeneity is seen.  PLAN:  1. Patient with long-standing hypothyroidism, not on thyroid hormones. She returns after an almost 3 year absence. At last visit we started levothyroxine therapy x 65mo but she did not feel well (generalized pain, fatigue) >> then started iodine and a thyroid bovine extract >> she did not feel different on this and her TSH increased to 10 4 mo ago. She was advised by her naturopathic provider to return to see me. She is off her supplements now. - latest thyroid labs reviewed with pt >> normal in 2016 >> then increased - I suggested to start Tirosint (she inquired about this as she needs a formulation that is gluten-free) >> she agrees >> coupon card given - we discussed about taking the thyroid hormone every day, with water, >30 minutes before breakfast, separated by >4 hours from acid reflux medications, calcium, iron, multivitamins.  - will check thyroid tests today: TSH, free T3,and fT4 - after we start Tirosint, she will need to return for repeat TFTs in 1.5 months - RTC in 3 mo  2. Enlarged thyroid - her thyroid is slightly larger and symmetric - some neck compression sxs - she did have a thyroid U/S 3 years ago >> normal - we discussed that the high TSH is also a  trophic factor for the thyroid >> we decided to try to lower the TSH and see if she still has the neck discomfort after TSH normalizes  - time spent with the patient: 40 min , of which >50% was spent in obtaining information about her symptoms, reviewing her previous labs, evaluations, and treatments, counseling her about her condition (please see the discussed topics above), and developing a plan to further investigate and tx it; she had a number of questions which I addressed.  Office Visit on 04/12/2017  Component Date Value Ref Range Status  . TSH 04/12/2017 7.01* 0.35 - 4.50 uIU/mL Final  . Free T4 04/12/2017 0.94  0.60 - 1.60 ng/dL Final   Comment: Specimens from patients who are undergoing biotin therapy and /or ingesting biotin supplements may contain high levels of biotin.  The higher biotin concentration in these specimens interferes with this Free T4 assay.  Specimens that contain high levels  of biotin may cause false  high results for this Free T4 assay.  Please interpret results in light of the total clinical presentation of the patient.    . T3, Free 04/12/2017 3.4  2.3 - 4.2 pg/mL Final   Will try to start 25 mcg Tirosint and have her back for labs in 1.5 mo for recheck.  Carlus Pavlov, MD PhD [facility] Endocrinology

## 2017-05-23 ENCOUNTER — Telehealth: Payer: Self-pay | Admitting: Internal Medicine

## 2017-05-23 ENCOUNTER — Telehealth: Payer: Self-pay

## 2017-05-23 NOTE — Telephone Encounter (Signed)
Called patient and advised her to contact her PCP doctor that drew the labs and get them to fax them over. She stated she would call them and have them fax to make sure we did not need anything else. 

## 2017-05-23 NOTE — Telephone Encounter (Signed)
Called patient and advised her to contact her PCP doctor that drew the labs and get them to fax them over. She stated she would call them and have them fax to make sure we did not need anything else.

## 2017-05-23 NOTE — Telephone Encounter (Signed)
Patient called in reference to having labs drawn at [facility]. Patient would like to know if these labs can just be sent over to Dr. Elvera Lennox or if patient still needs to come in the office to have labs drawn here. Please call patient and advise. OK to leave message.

## 2017-05-24 ENCOUNTER — Other Ambulatory Visit: Payer: 59

## 2017-06-12 ENCOUNTER — Encounter: Payer: Self-pay | Admitting: Internal Medicine

## 2017-06-13 ENCOUNTER — Other Ambulatory Visit: Payer: Self-pay | Admitting: Internal Medicine

## 2017-06-13 MED ORDER — TIROSINT 50 MCG PO CAPS: 50.0000 ug | ORAL_CAPSULE | Freq: Every day | ORAL | 3 refills | Status: DC

## 2017-06-14 ENCOUNTER — Other Ambulatory Visit: Payer: Self-pay

## 2017-06-14 ENCOUNTER — Telehealth: Payer: Self-pay | Admitting: Internal Medicine

## 2017-06-14 MED ORDER — TIROSINT 50 MCG PO CAPS: 50.0000 ug | ORAL_CAPSULE | Freq: Every day | ORAL | 3 refills | Status: DC

## 2017-06-14 NOTE — Telephone Encounter (Signed)
Please advise. Thank you

## 2017-06-14 NOTE — Telephone Encounter (Signed)
Patient needs prescription with the increase for Tirosint asap. Pharmacy has not been able to obtain authorization from Dr. Elvera LennoxGherghe. Pharmacy is [facility] [place]. Patient is out of medication

## 2017-06-14 NOTE — Telephone Encounter (Signed)
I sent this yesterday >> can you please call them to see what the pb is? Ty!

## 2017-06-14 NOTE — Telephone Encounter (Signed)
I resubmitted to the pharmacy as they may not have gotten it the first time.

## 2017-06-17 ENCOUNTER — Other Ambulatory Visit: Payer: Self-pay

## 2017-06-17 MED ORDER — TIROSINT 50 MCG PO CAPS: 50.0000 ug | ORAL_CAPSULE | Freq: Every day | ORAL | 3 refills | Status: DC

## 2017-06-17 NOTE — Telephone Encounter (Signed)
Resubmitted

## 2017-06-17 NOTE — Telephone Encounter (Signed)
Patient called and stated that her RX for the Tirosint was not sent to her pharmacy. Can you please resend . Thank you

## 2017-07-18 ENCOUNTER — Ambulatory Visit: Payer: 59 | Admitting: Internal Medicine

## 2017-07-19 ENCOUNTER — Ambulatory Visit: Payer: 59 | Admitting: Internal Medicine

## 2017-07-19 ENCOUNTER — Encounter: Payer: Self-pay | Admitting: Internal Medicine

## 2017-07-19 VITALS — BP 104/72 | HR 88 | Wt 121.2 lb

## 2017-07-19 DIAGNOSIS — Z90710 Acquired absence of both cervix and uterus: Secondary | ICD-10-CM

## 2017-07-19 DIAGNOSIS — E039 Hypothyroidism, unspecified: Secondary | ICD-10-CM

## 2017-07-19 DIAGNOSIS — E049 Nontoxic goiter, unspecified: Secondary | ICD-10-CM

## 2017-07-19 LAB — T4, FREE: Free T4: 1.11 ng/dL (ref 0.60–1.60)

## 2017-07-19 LAB — TSH: TSH: 0.8 u[IU]/mL (ref 0.35–4.50)

## 2017-07-19 NOTE — Progress Notes (Signed)
Patient ID: Jamie Gill, female   DOB: 07/05/1976, 41 y.o.   MRN: [id number]   HPI  Jamie ConteJennifer M Gill is a 41 y.o.-year-old female, initially referred by Erick ColaceBarbara Griggs, returning for f/u for hypothyroidism and enlarged thyroid. Last visit 3 months ago.  She had a hysterectomy 2011 (at [age]). She started Pregnenolone by her [facility] provider. She will also start Estrogen probably in 08/2017.  Reviewed history: Pt. has been dx with hypothyroidism in 2000- started to see [facility] - started on Armour - it helped a little but not permanently - started to see Erick ColaceBarbara Griggs ([facility] >> started on many supplements including 2 for thyroid (see below).  She was previously on the following supplements: - Thyrotropin PMG 2 tabs and b'fast, 1 at dinner - Promaline iodine 1 at b'fast We stopped these supplements and started levothyroxine 50 mcg daily >> weight gain, generalized pain, tired  - felt worse.  She then started: - iodine - thyroxal (bovine thyroid glandular, coenzymes, and phytonutrients such as ashwagandha root)  She presented 3 months ago after an absence of 3 years after a TSH returned high, at 10 on 12/17/2016.  We rechecked her TSH and this was still high, at 7.  At that point, I suggested to start a low-dose Tirosint 25 mcg daily, which she started with good results.  She then saw Isabella Bowenshristen Duke at had [facility] >> TSH better, at 3.84.  She was suggested to increase the Tirosint to 50 mcg daily.  Patient first checked with me about this and we decided to go ahead with the dose increase.  Pt is now on Tirosint 50 mcg daily, taken: - in am - fasting - at least 1h from b'fast - no Ca, Fe, MVI, PPIs - not on Biotin  Reviewed most recent TFTs: 05/2017 ([facility] - tests sent by pt: TSH - 3.840  Thyroxine (T4) Free, Direct - 1.49  Reverse T3, Serum - 15.3  Triiodothyronine (T3) Free - 3.0  Lab Results   Component Value Date   TSH 7.01 (H) 04/12/2017   TSH 1.47 09/17/2014   TSH 3.31 06/16/2014   FREET4 0.94 04/12/2017   FREET4 0.97 09/17/2014   FREET4 1.11 06/16/2014  12/17/2016: TSH 10.03, fT4 1.12, fT 3.3 2016: TSH 6.13, fT41.35, fT3 2.8 03/31/2014: TSH 12.43; TT4 5.4, fT3 2.9, fT4 0.98  11/16/2013: TSH 10.57, ATA <1.0, TPO Abs 9 (0-34), rT3 9.4 (9.2-24.1), TT4 4.8, fT3 3.6  At last visit, she was complaining of neck swelling along with feeling nodules in the neck, hoarseness, neck discomfort, but they have resolved now.  She has a history of depression; had a hysterectomy (TAH) in 2011 - adenomyosis; adrenal fatigue, leaky gut, SSTV to gluten.  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  ROS: Constitutional: no weight gain/no weight loss, + fatigue, no subjective hyperthermia, no subjective hypothermia Eyes: no blurry vision, no xerophthalmia ENT: no sore throat, no nodules palpated in throat, no dysphagia, no odynophagia, no hoarseness Cardiovascular: no CP/no SOB/no palpitations/no leg swelling Respiratory: no cough/no SOB/no wheezing Gastrointestinal: no N/no V/no D/no C/no acid reflux Musculoskeletal: no muscle aches/no joint aches Skin: no rashes, no hair loss Neurological: no tremors/no numbness/no tingling/no dizziness  I reviewed pt's medications, allergies, PMH, social hx, family hx, and changes were documented in the history of present illness. Otherwise, unchanged from my initial visit note.  Past  Medical History:  Diagnosis Date  . Depression   . Hypothyroidism    Past Surgical History:  Procedure Laterality Date  . ABDOMINAL HYSTERECTOMY  02/09/2010   LAVH  . ANKLE ARTHROSCOPY  12/04/2011   Procedure: ANKLE ARTHROSCOPY;  Surgeon: Velna Ochs, MD;  Location: [facility];  Service: Orthopedics;  Laterality: Right;  right ankle arthroscopy with synovectomy  . CESAREAN  SECTION  01/20/2008; 04/15/2005   History   Social History  . Marital Status: Married    Spouse Name: N/A    Number of Children: 2   Occupational History  . Co-director preschool   Social History Main Topics  . Smoking status: Never Smoker   . Smokeless tobacco: Not on file  . Alcohol Use: Yes     Comment:beer   . Drug Use: No   Current Outpatient Medications  Medication Sig Dispense Refill  . TIROSINT 50 MCG CAPS Take 1 capsule (50 mcg total) by mouth daily before breakfast. 45 capsule 3   No current facility-administered medications for this visit.      Allergies  Allergen Reactions  . Magnesium Sulfate Anaphylaxis   FH: - HTN in father - lung cancer in father  PE: BP 104/72   Pulse 88   Wt 121 lb 3.2 oz (55 kg)   SpO2 99%   BMI 22.17 kg/m  Wt Readings from Last 3 Encounters:  07/19/17 121 lb 3.2 oz (55 kg)  04/12/17 116 lb (52.6 kg)  09/17/14 123 lb 12.8 oz (56.2 kg)   Constitutional: normal weight, in NAD Eyes: PERRLA, EOMI, no exophthalmos ENT: moist mucous membranes, + slight. symmetric thyromegaly - thyroid high in neck, no cervical lymphadenopathy Cardiovascular: RRR, No MRG Respiratory: CTA B Gastrointestinal: abdomen soft, NT, ND, BS+ Musculoskeletal: no deformities, strength intact in all 4 Skin: moist, warm, no rashes Neurological: no tremor with outstretched hands, DTR normal in all 4  ASSESSMENT: 1. Hypothyroidism  2. Enlarged thyroid - thyroid U/S (05/20/2014): No focal nodule is identified. No significant heterogeneity is seen.  3. H/o TAH  PLAN:  1. Patient with hypothyroidism, currently on Tirosint as she did not feel feel well on levothyroxine in the past (generalized pain, fatigue).  She also tried a thyroid bovine extract + iodine in the past but did not feel different on this so she stopped.  Her TSH increased to 10 before our last visit and I suggested to start a low-dose Tirosint, 25 mcg daily, which does not contain gluten.  She  did well on this and had labs repeated on 06/12/2017 with a TSH at the upper limit of normal.  At that time, her integrative medicine provider recommended to increase the dose to 50 mcg daily and I agreed with the change.  - latest thyroid labs from 05/2017 reviewed with pt >> TSH normal, but high in the normal range, after which we increased her dose - she continues on liquid LT4 50 mcg daily - pt feels good on this dose. - we discussed about taking the thyroid hormone every day, with water, >30 minutes before breakfast, separated by >4 hours from acid reflux medications, calcium, iron, multivitamins. Pt. is taking it correctly - will check thyroid tests today: TSH and fT4 - If labs are abnormal, she will need to return for repeat TFTs in 1.5 months - OTW, RTC in 6 mo  2. Enlarged thyroid - Thyroid is slightly larger and symmetric - she had a normal thyroid U/S ~3 years ago  -  At this visit, we discussed that the high TSH because of the slight enlargement in the thyroid.  We decided to try to lower the TSH first and see if she continues to have the neck discomfort after that her TSH normalized.   This visit, she confirms that her neck compression sxs have resolved.  3. H/o TAH - her progesterone level was reportedly low > started Pregnenolone and may start Estrogen replacement too, however, her estradiol level was apparently normal. Discussed the importance of HRT if she does have ovarian insufficiency and the time of discontinuation ~ 41 y/o, the ave age of menopause in [place].  Office Visit on 07/19/2017  Component Date Value Ref Range Status  . TSH 07/19/2017 0.80  0.35 - 4.50 uIU/mL Final  . Free T4 07/19/2017 1.11  0.60 - 1.60 ng/dL Final   Comment: Specimens from patients who are undergoing biotin therapy and /or ingesting biotin supplements may contain high levels of biotin.  The higher biotin concentration in these specimens interferes with this Free T4 assay.  Specimens that contain high  levels  of biotin may cause false high results for this Free T4 assay.  Please interpret results in light of the total clinical presentation of the patient.     Thyroid labs are normal  Carlus Pavlovristina Ciani Rutten, MD PhD [facility] Endocrinology

## 2017-07-19 NOTE — Patient Instructions (Signed)
Please continue Tirosint 50 mcg daily.  Take the thyroid hormone every day, with water, at least 30 minutes before breakfast, separated by at least 4 hours from: - acid reflux medications - calcium - iron - multivitamins  Please stop at the lab.  Please come back for a follow-up appointment in 6 months.

## 2017-07-22 MED ORDER — TIROSINT 50 MCG PO CAPS: 50.0000 ug | ORAL_CAPSULE | Freq: Every day | ORAL | 3 refills | Status: AC

## 2018-01-14 ENCOUNTER — Ambulatory Visit: Payer: 59 | Admitting: Internal Medicine

## 2018-04-23 ENCOUNTER — Other Ambulatory Visit: Payer: Self-pay | Admitting: Gastroenterology

## 2018-04-23 DIAGNOSIS — R1031 Right lower quadrant pain: Principal | ICD-10-CM

## 2018-04-23 DIAGNOSIS — G8929 Other chronic pain: Secondary | ICD-10-CM

## 2018-04-24 ENCOUNTER — Ambulatory Visit (HOSPITAL_COMMUNITY)
Admission: RE | Admit: 2018-04-24 | Discharge: 2018-04-24 | Disposition: A | Discharge status: Home / Self Care | Payer: 59 | Source: Ambulatory Visit | Attending: Gastroenterology | Admitting: Gastroenterology

## 2018-04-24 ENCOUNTER — Other Ambulatory Visit: Payer: Self-pay | Admitting: Gastroenterology

## 2018-04-24 ENCOUNTER — Encounter (HOSPITAL_COMMUNITY): Payer: Self-pay

## 2018-04-24 DIAGNOSIS — R748 Abnormal levels of other serum enzymes: Secondary | ICD-10-CM | POA: Diagnosis not present

## 2018-04-24 DIAGNOSIS — R1031 Right lower quadrant pain: Secondary | ICD-10-CM

## 2018-04-24 DIAGNOSIS — N839 Noninflammatory disorder of ovary, fallopian tube and broad ligament, unspecified: Secondary | ICD-10-CM | POA: Diagnosis not present

## 2018-04-24 DIAGNOSIS — M5136 Other intervertebral disc degeneration, lumbar region: Secondary | ICD-10-CM | POA: Insufficient documentation

## 2018-04-24 MED ORDER — IOHEXOL 300 MG/ML  SOLN
100.0000 mL | Freq: Once | INTRAMUSCULAR | Status: AC | PRN
  Administered 2018-04-24: 80 mL via INTRAVENOUS

## 2018-04-24 MED ORDER — IOPAMIDOL (ISOVUE-300) INJECTION 61%
INTRAVENOUS | Status: AC
  Filled 2018-04-24: qty 30

## 2018-04-24 MED ORDER — IOPAMIDOL (ISOVUE-300) INJECTION 61%: 30.0000 mL | Freq: Once | INTRAVENOUS | Status: DC | PRN

## 2018-04-28 ENCOUNTER — Ambulatory Visit (HOSPITAL_COMMUNITY): Admission: RE | Admit: 2018-04-28 | Payer: 59 | Source: Ambulatory Visit

## 2019-06-21 IMAGING — CT CT ABD-PELV W/ CM
2 of 5 series · 15 of 46 positions shown, 17 images · IV contrast (OMNIPAQUE)
Comparison: Abdominal radiographs from 01/26/2009

CLINICAL DATA: Right lower quadrant abdominal pain and diarrhea
over the last 10 days.

EXAM:
CT ABDOMEN AND PELVIS WITH CONTRAST
TECHNIQUE: Multidetector CT imaging of the abdomen and pelvis was performed
using the standard protocol following bolus administration of
intravenous contrast.
CONTRAST:  80mL OMNIPAQUE IOHEXOL 300 MG/ML  SOLN

[Series 2: axial st · axial · 0.62mm/px · z∈[-424,-54]mm · 12 of 88 slices shown, 14 images]
[im 7/88  soft-tissue]
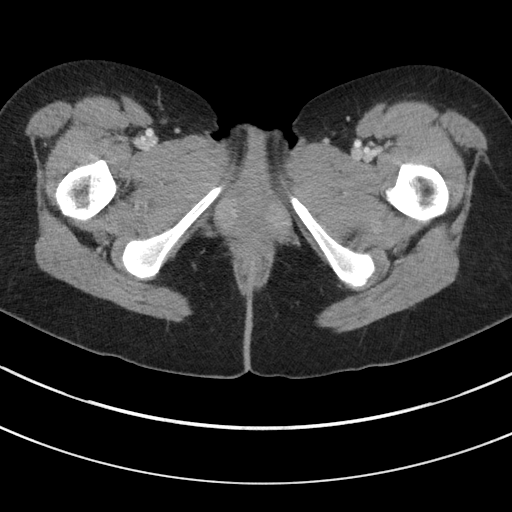
[im 7/88  bone]
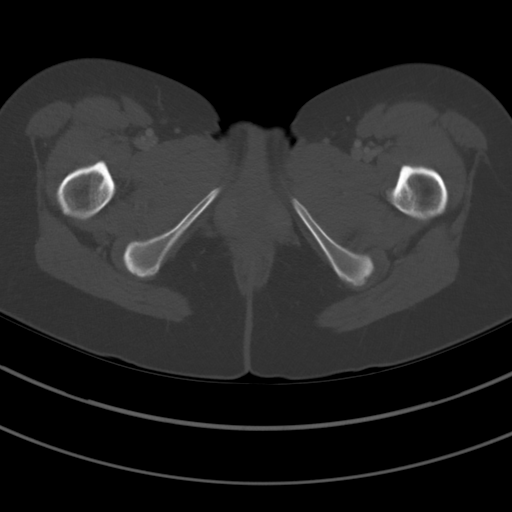
[im 13/88  soft-tissue]
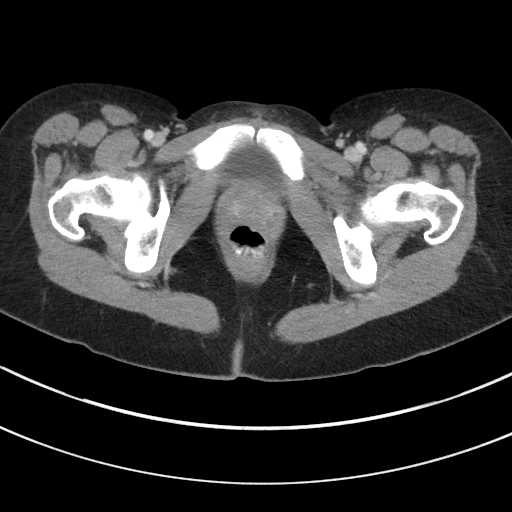
[im 19/88  soft-tissue]
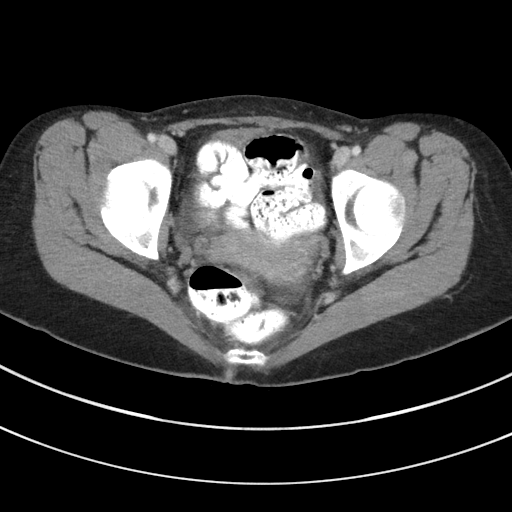
[im 25/88  soft-tissue]
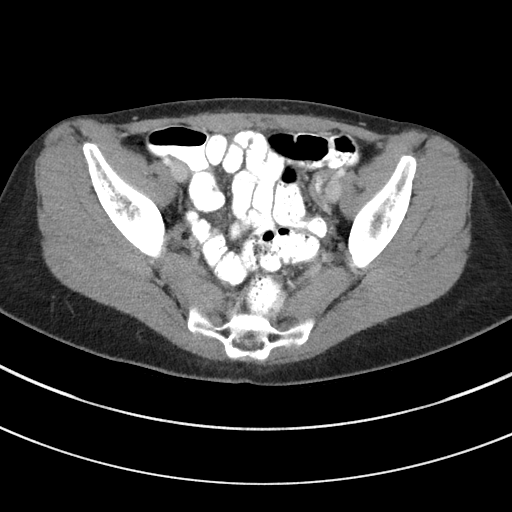
[im 32/88  soft-tissue]
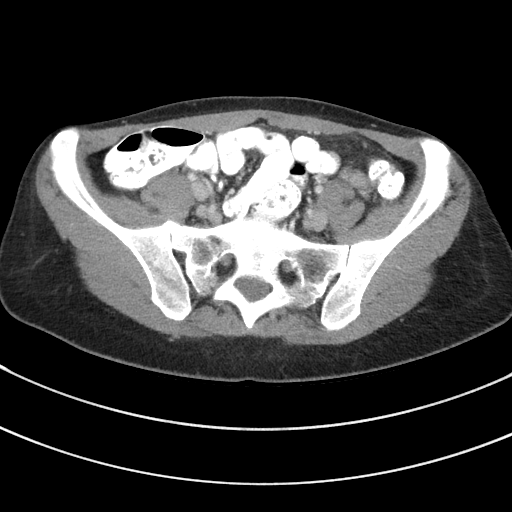
[im 38/88  soft-tissue]
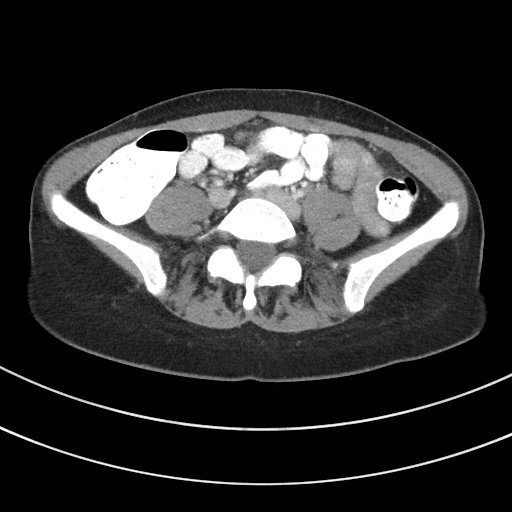
[im 50/88  soft-tissue]
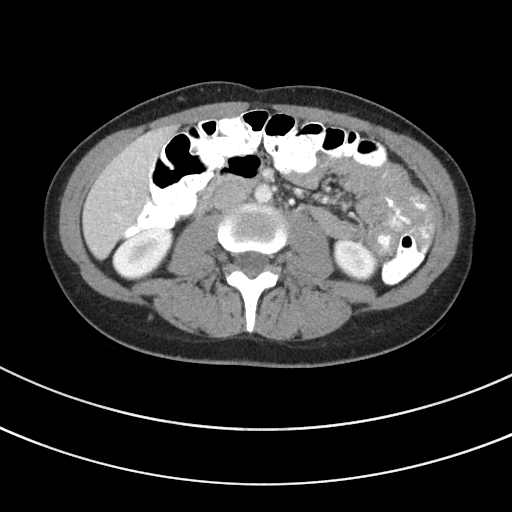
[im 56/88  soft-tissue]
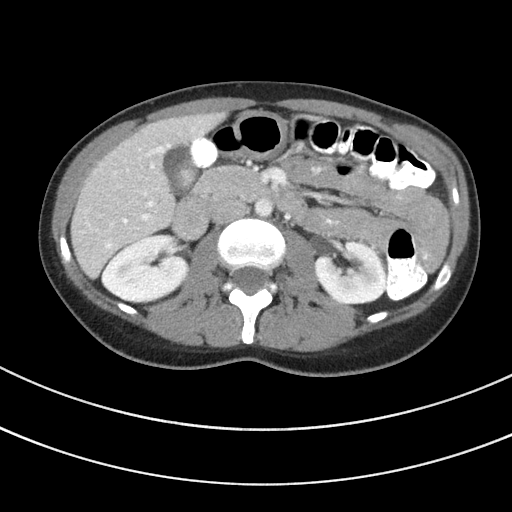
[im 63/88  soft-tissue]
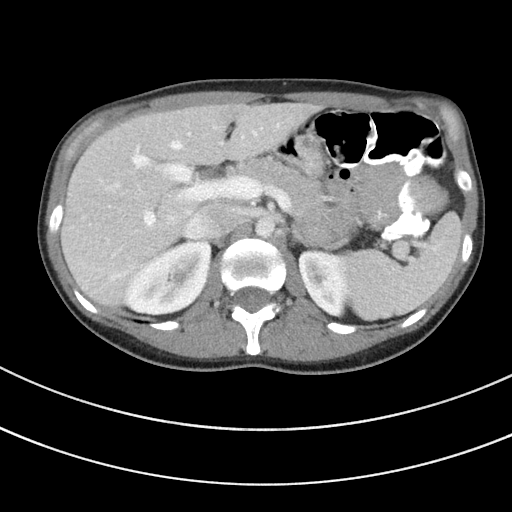
[im 63/88  bone]
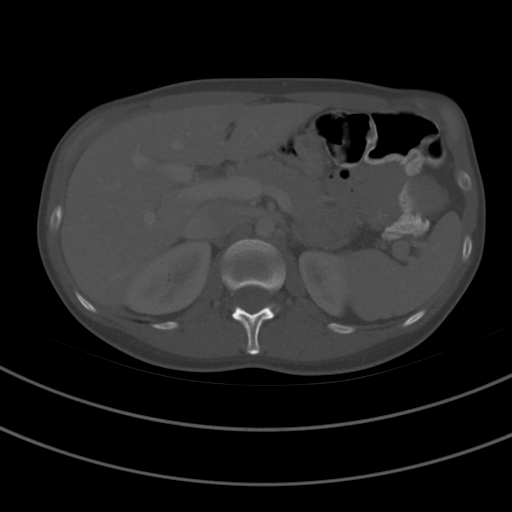
[im 69/88  soft-tissue]
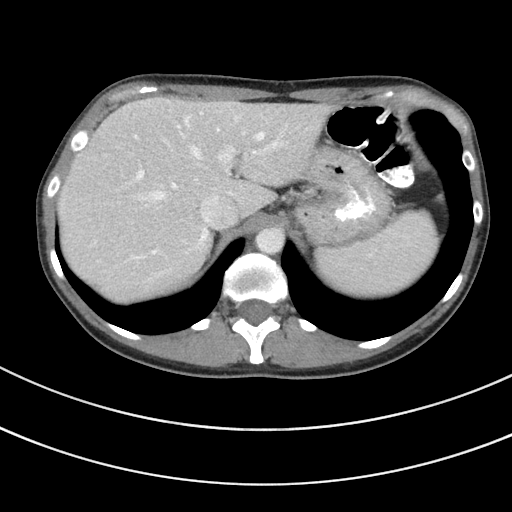
[im 75/88  soft-tissue]
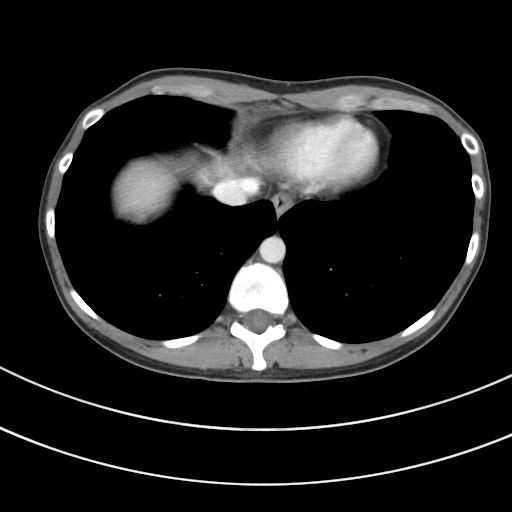
[im 81/88  soft-tissue]
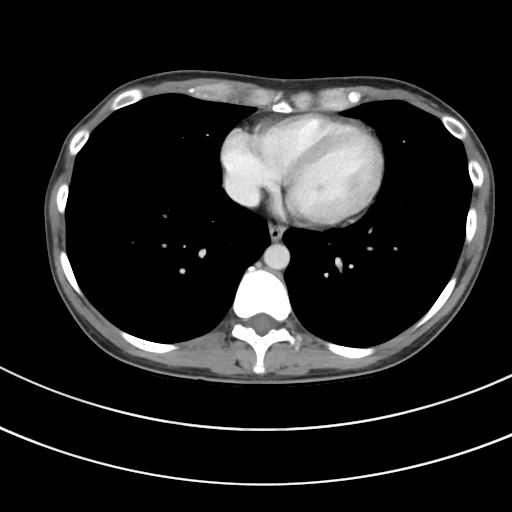

[Series 5: coronal st · coronal · 0.57mm/px · 3 of 77 slices shown]
[im 26/77  soft-tissue]
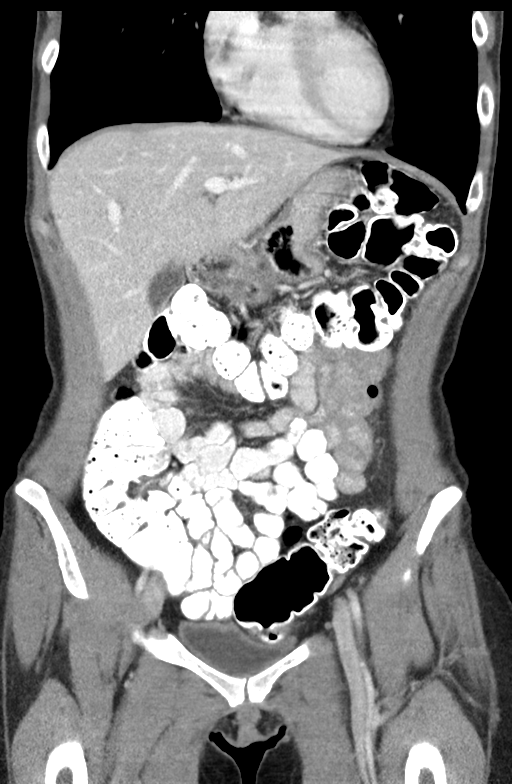
[im 34/77  soft-tissue]
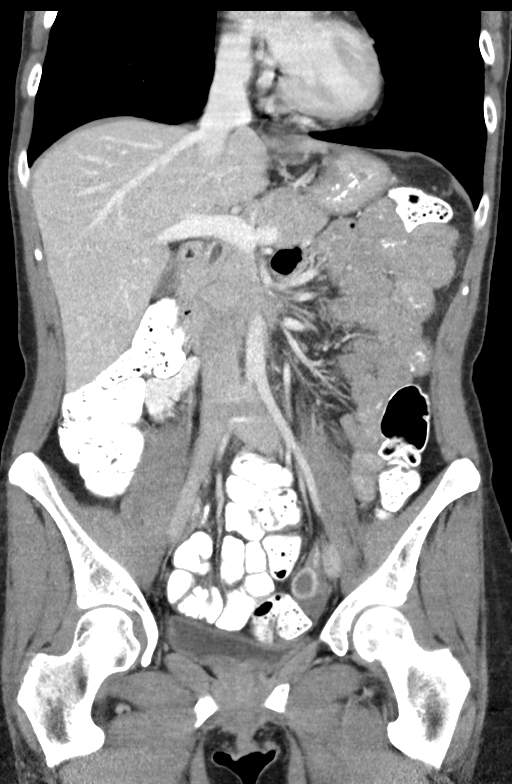
[im 43/77  soft-tissue]
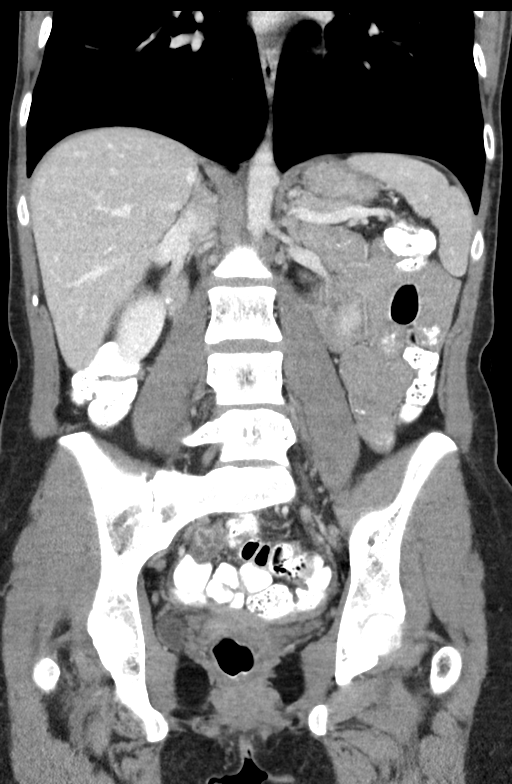

[15 of 46 positions shown; findings below may reference images not displayed]

FINDINGS: Lower chest: Unremarkable

Hepatobiliary: Borderline prominent common bile duct at 5 mm
diameter, previously 4 mm in diameter on 09/22/2006. Gallbladder
unremarkable. No intrahepatic biliary dilatation. The liver appears
otherwise normal.

Pancreas: Unremarkable

Spleen: Unremarkable

Adrenals/Urinary Tract: Unremarkable

Stomach/Bowel: Normal appendix shown on images 32 through 35 of
series 5. Terminal ileum unremarkable. Orally administered contrast
extends through to the rectum. No appreciable bowel wall thickening.

Vascular/Lymphatic: Unremarkable

Reproductive: Uterus absent. 1.4 cm rim enhancing right ovarian
lesion and similar appearing 1.7 cm left ovarian rim enhancing
lesion, both possibly follicles but not entirely specific.

Other: No free pelvic fluid or other specific abnormality observed.

Musculoskeletal: Mild bridging spurring of the right sacroiliac
joint. Degenerative endplate sclerosis eccentric to the right at
L4-5. No lumbar impingement identified.
IMPRESSION: 1. The appendix appears normal.
2. Single bilateral rim enhancing lesions in both ovaries, most
likely follicles or small complex cystic lesions but technically
nonspecific. There is no surrounding free pelvic fluid, but if
ovarian origin of the patient's pelvic pain is considered possible
then consider pelvic sonography.
3. No bowel wall thickening is observed.  No dilated bowel.
4. Borderline prominent common bile duct diameter at 5 mm, although
this measured 4 mm in diameter back in 6771 and there is no
intrahepatic biliary dilatation. I am skeptical that this warrants
further workup unless the patient has abnormal liver enzymes or
elevated bilirubin.
5. Degenerative endplate sclerosis eccentric to the right at L4-5.
Mild bridging spurring of the right sacroiliac joint.

## 2023-11-20 ENCOUNTER — Other Ambulatory Visit: Payer: Self-pay

## 2023-11-21 LAB — SURGICAL PATHOLOGY

## 2024-04-30 ENCOUNTER — Other Ambulatory Visit: Payer: Self-pay | Admitting: Obstetrics and Gynecology

## 2024-04-30 DIAGNOSIS — R9230 Dense breasts, unspecified: Secondary | ICD-10-CM

## 2024-05-11 ENCOUNTER — Ambulatory Visit
Admission: RE | Admit: 2024-05-11 | Discharge: 2024-05-11 | Disposition: A | Discharge status: Home / Self Care | Source: Ambulatory Visit | Attending: Obstetrics and Gynecology | Admitting: Obstetrics and Gynecology

## 2024-05-11 DIAGNOSIS — R9230 Dense breasts, unspecified: Secondary | ICD-10-CM

## 2024-05-11 MED ORDER — GADOPICLENOL 0.5 MMOL/ML IV SOLN
5.5000 mL | Freq: Once | INTRAVENOUS | Status: AC | PRN
Start: 2024-05-11 — End: 2024-05-11
  Administered 2024-05-11: 5.5 mL via INTRAVENOUS
